# Patient Record
Sex: Male | Born: 1935 | Race: White | Hispanic: No | Smoking: Never smoker
Health system: Southern US, Community
[De-identification: ages and names within clinical notes are randomized; demographics above are authoritative.]

## PROBLEM LIST (undated history)

## (undated) DIAGNOSIS — I38 Endocarditis, valve unspecified: Secondary | ICD-10-CM

## (undated) DIAGNOSIS — M1990 Unspecified osteoarthritis, unspecified site: Secondary | ICD-10-CM

## (undated) DIAGNOSIS — F329 Major depressive disorder, single episode, unspecified: Secondary | ICD-10-CM

## (undated) DIAGNOSIS — R7303 Prediabetes: Secondary | ICD-10-CM

## (undated) DIAGNOSIS — I209 Angina pectoris, unspecified: Secondary | ICD-10-CM

## (undated) DIAGNOSIS — E785 Hyperlipidemia, unspecified: Secondary | ICD-10-CM

## (undated) DIAGNOSIS — I2510 Atherosclerotic heart disease of native coronary artery without angina pectoris: Secondary | ICD-10-CM

## (undated) DIAGNOSIS — I10 Essential (primary) hypertension: Secondary | ICD-10-CM

## (undated) DIAGNOSIS — Q245 Malformation of coronary vessels: Secondary | ICD-10-CM

## (undated) DIAGNOSIS — F32A Depression, unspecified: Secondary | ICD-10-CM

## (undated) DIAGNOSIS — R112 Nausea with vomiting, unspecified: Secondary | ICD-10-CM

## (undated) DIAGNOSIS — Z8719 Personal history of other diseases of the digestive system: Secondary | ICD-10-CM

## (undated) DIAGNOSIS — K219 Gastro-esophageal reflux disease without esophagitis: Secondary | ICD-10-CM

## (undated) DIAGNOSIS — Z87442 Personal history of urinary calculi: Secondary | ICD-10-CM

## (undated) DIAGNOSIS — C61 Malignant neoplasm of prostate: Secondary | ICD-10-CM

## (undated) DIAGNOSIS — N200 Calculus of kidney: Secondary | ICD-10-CM

## (undated) DIAGNOSIS — E119 Type 2 diabetes mellitus without complications: Secondary | ICD-10-CM

## (undated) HISTORY — PX: HEMORRHOID SURGERY: SHX153

## (undated) HISTORY — PX: PROSTATE SURGERY: SHX751

## (undated) HISTORY — PX: TONSILLECTOMY: SUR1361

---

## 2003-11-09 ENCOUNTER — Ambulatory Visit: Payer: Self-pay | Admitting: Urology

## 2003-12-03 ENCOUNTER — Other Ambulatory Visit: Payer: Self-pay

## 2003-12-07 ENCOUNTER — Inpatient Hospital Stay: Payer: Self-pay | Admitting: Urology

## 2006-02-15 ENCOUNTER — Ambulatory Visit: Payer: Self-pay | Admitting: Gastroenterology

## 2006-05-03 ENCOUNTER — Ambulatory Visit: Payer: Self-pay | Admitting: Otolaryngology

## 2007-02-03 DIAGNOSIS — I2109 ST elevation (STEMI) myocardial infarction involving other coronary artery of anterior wall: Secondary | ICD-10-CM

## 2007-02-03 DIAGNOSIS — I2510 Atherosclerotic heart disease of native coronary artery without angina pectoris: Secondary | ICD-10-CM

## 2007-02-03 HISTORY — DX: ST elevation (STEMI) myocardial infarction involving other coronary artery of anterior wall: I21.09

## 2007-02-03 HISTORY — DX: Atherosclerotic heart disease of native coronary artery without angina pectoris: I25.10

## 2007-02-03 HISTORY — PX: CARDIAC CATHETERIZATION: SHX172

## 2007-02-04 DIAGNOSIS — Z951 Presence of aortocoronary bypass graft: Secondary | ICD-10-CM

## 2007-02-04 HISTORY — PX: CORONARY ARTERY BYPASS GRAFT: SHX141

## 2007-02-04 HISTORY — DX: Presence of aortocoronary bypass graft: Z95.1

## 2008-09-18 ENCOUNTER — Ambulatory Visit: Payer: Self-pay | Admitting: Ophthalmology

## 2008-12-30 ENCOUNTER — Ambulatory Visit: Payer: Self-pay | Admitting: Gastroenterology

## 2008-12-30 DIAGNOSIS — D126 Benign neoplasm of colon, unspecified: Secondary | ICD-10-CM

## 2008-12-30 HISTORY — DX: Benign neoplasm of colon, unspecified: D12.6

## 2008-12-30 HISTORY — PX: COLONOSCOPY: SHX174

## 2009-09-01 ENCOUNTER — Ambulatory Visit: Payer: Self-pay | Admitting: Ophthalmology

## 2009-09-14 ENCOUNTER — Ambulatory Visit: Payer: Self-pay | Admitting: Ophthalmology

## 2010-02-10 ENCOUNTER — Ambulatory Visit: Payer: Self-pay | Admitting: Cardiology

## 2010-02-10 HISTORY — PX: CARDIAC CATHETERIZATION: SHX172

## 2010-10-14 ENCOUNTER — Ambulatory Visit: Payer: Self-pay | Admitting: Ophthalmology

## 2013-05-03 DIAGNOSIS — I2510 Atherosclerotic heart disease of native coronary artery without angina pectoris: Secondary | ICD-10-CM | POA: Insufficient documentation

## 2013-05-03 DIAGNOSIS — N200 Calculus of kidney: Secondary | ICD-10-CM | POA: Insufficient documentation

## 2013-12-25 DIAGNOSIS — E538 Deficiency of other specified B group vitamins: Secondary | ICD-10-CM | POA: Insufficient documentation

## 2014-03-20 ENCOUNTER — Ambulatory Visit: Payer: Self-pay | Admitting: Gastroenterology

## 2014-03-20 HISTORY — PX: COLONOSCOPY: SHX174

## 2015-01-19 ENCOUNTER — Encounter: Payer: Self-pay | Admitting: Emergency Medicine

## 2015-01-19 DIAGNOSIS — N200 Calculus of kidney: Secondary | ICD-10-CM | POA: Insufficient documentation

## 2015-01-19 DIAGNOSIS — R109 Unspecified abdominal pain: Secondary | ICD-10-CM | POA: Diagnosis present

## 2015-01-19 LAB — CBC
HCT: 39.4 % — ABNORMAL LOW (ref 40.0–52.0)
Hemoglobin: 14 g/dL (ref 13.0–18.0)
MCH: 31.2 pg (ref 26.0–34.0)
MCHC: 35.5 g/dL (ref 32.0–36.0)
MCV: 87.7 fL (ref 80.0–100.0)
PLATELETS: 128 10*3/uL — AB (ref 150–440)
RBC: 4.5 MIL/uL (ref 4.40–5.90)
RDW: 13.2 % (ref 11.5–14.5)
WBC: 5.1 10*3/uL (ref 3.8–10.6)

## 2015-01-19 LAB — COMPREHENSIVE METABOLIC PANEL
ALT: 27 U/L (ref 17–63)
ANION GAP: 10 (ref 5–15)
AST: 28 U/L (ref 15–41)
Albumin: 4.4 g/dL (ref 3.5–5.0)
Alkaline Phosphatase: 50 U/L (ref 38–126)
BUN: 21 mg/dL — ABNORMAL HIGH (ref 6–20)
CALCIUM: 9.5 mg/dL (ref 8.9–10.3)
CHLORIDE: 103 mmol/L (ref 101–111)
CO2: 26 mmol/L (ref 22–32)
CREATININE: 1.2 mg/dL (ref 0.61–1.24)
GFR calc non Af Amer: 56 mL/min — ABNORMAL LOW (ref >60)
Glucose, Bld: 159 mg/dL — ABNORMAL HIGH (ref 65–99)
Potassium: 3.9 mmol/L (ref 3.5–5.1)
SODIUM: 139 mmol/L (ref 135–145)
Total Bilirubin: 0.8 mg/dL (ref 0.3–1.2)
Total Protein: 6.8 g/dL (ref 6.5–8.1)

## 2015-01-19 LAB — URINALYSIS COMPLETE WITH MICROSCOPIC (ARMC ONLY)
BILIRUBIN URINE: NEGATIVE
Bacteria, UA: NONE SEEN
Glucose, UA: NEGATIVE mg/dL
KETONES UR: NEGATIVE mg/dL
LEUKOCYTES UA: NEGATIVE
Nitrite: NEGATIVE
PROTEIN: 30 mg/dL — AB
Specific Gravity, Urine: 1.021 (ref 1.005–1.030)
pH: 8 (ref 5.0–8.0)

## 2015-01-19 MED ORDER — ONDANSETRON HCL 4 MG/2ML IJ SOLN
4.0000 mg | Freq: Once | INTRAMUSCULAR | Status: AC
  Administered 2015-01-20: 4 mg via INTRAVENOUS
  Filled 2015-01-19: qty 2

## 2015-01-19 NOTE — ED Notes (Signed)
Pt arrived to the ED accompanied by family for complaints of right flank pain with a hx of kidney stones. Pt states that this feels just like when he is passing a kidney stone. Pt is AOx4 in moderate pain distress.

## 2015-01-20 ENCOUNTER — Emergency Department: Payer: Medicare Other

## 2015-01-20 ENCOUNTER — Emergency Department
Admission: EM | Admit: 2015-01-20 | Discharge: 2015-01-20 | Disposition: A | Discharge status: Home / Self Care | Payer: Medicare Other | Attending: Emergency Medicine | Admitting: Emergency Medicine

## 2015-01-20 DIAGNOSIS — N200 Calculus of kidney: Secondary | ICD-10-CM

## 2015-01-20 DIAGNOSIS — R109 Unspecified abdominal pain: Secondary | ICD-10-CM

## 2015-01-20 HISTORY — DX: Calculus of kidney: N20.0

## 2015-01-20 HISTORY — DX: Malformation of coronary vessels: Q24.5

## 2015-01-20 MED ORDER — ONDANSETRON HCL 4 MG/2ML IJ SOLN: 4.0000 mg | Freq: Once | INTRAMUSCULAR | Status: DC

## 2015-01-20 MED ORDER — SODIUM CHLORIDE 0.9 % IV BOLUS (SEPSIS)
1000.0000 mL | Freq: Once | INTRAVENOUS | Status: AC
  Administered 2015-01-20: 1000 mL via INTRAVENOUS

## 2015-01-20 MED ORDER — MORPHINE SULFATE (PF) 2 MG/ML IV SOLN: 2.0000 mg | Freq: Once | INTRAVENOUS | Status: DC

## 2015-01-20 MED ORDER — ONDANSETRON 4 MG PO TBDP: 4.0000 mg | ORAL_TABLET | Freq: Three times a day (TID) | ORAL | Status: DC | PRN

## 2015-01-20 MED ORDER — TAMSULOSIN HCL 0.4 MG PO CAPS: 0.4000 mg | ORAL_CAPSULE | Freq: Every day | ORAL | Status: DC

## 2015-01-20 MED ORDER — TAMSULOSIN HCL 0.4 MG PO CAPS
0.4000 mg | ORAL_CAPSULE | Freq: Once | ORAL | Status: AC
  Administered 2015-01-20: 0.4 mg via ORAL
  Filled 2015-01-20: qty 1

## 2015-01-20 MED ORDER — KETOROLAC TROMETHAMINE 30 MG/ML IJ SOLN
30.0000 mg | Freq: Once | INTRAMUSCULAR | Status: AC
  Administered 2015-01-20: 30 mg via INTRAVENOUS
  Filled 2015-01-20: qty 1

## 2015-01-20 MED ORDER — MORPHINE SULFATE (PF) 2 MG/ML IV SOLN
2.0000 mg | Freq: Once | INTRAVENOUS | Status: AC
  Administered 2015-01-20: 2 mg via INTRAVENOUS
  Filled 2015-01-20: qty 1

## 2015-01-20 MED ORDER — OXYCODONE-ACETAMINOPHEN 5-325 MG PO TABS: 1.0000 | ORAL_TABLET | ORAL | Status: DC | PRN

## 2015-01-20 NOTE — ED Provider Notes (Signed)
[facility] Emergency Department Provider Note  ____________________________________________  Time seen: 1:25 AM  I have reviewed the triage vital signs and the nursing notes.   HISTORY  Chief Complaint Nephrolithiasis     HPI Adam Nelson is a 81 y.o. male presents with acute onset of right flank pain currently 7 out of 10 at 7:00 last night. Patient also admits to nausea accompanying the pain. Patient has a history of kidney stones she states proximal was 7 years ago for which she was seen by Dr. Yves Dill.     Past Medical History  Diagnosis Date  . Kidney stones   . Coronary artery anomaly     There are no active problems to display for this patient.   Past surgical history None No current outpatient prescriptions on file.  Allergies No known drug allergies History reviewed. No pertinent family history.  Social History Social History  Substance Use Topics  . Smoking status: Never Smoker   . Smokeless tobacco: None  . Alcohol Use: No    Review of Systems  Constitutional: Negative for fever. Eyes: Negative for visual changes. ENT: Negative for sore throat. Cardiovascular: Negative for chest pain. Respiratory: Negative for shortness of breath. Gastrointestinal: Positive for right flank pain and nausea Genitourinary: Negative for dysuria. Musculoskeletal: Negative for back pain. Skin: Negative for rash. Neurological: Negative for headaches, focal weakness or numbness.   10-point ROS otherwise negative.  ____________________________________________   PHYSICAL EXAM:  VITAL SIGNS: ED Triage Vitals  Enc Vitals Group     BP 01/19/15 2147 120/80 mmHg     Pulse Rate 01/19/15 2147 58     Resp 01/19/15 2147 20     Temp 01/19/15 2147 97.6 F (36.4 C)     Temp Source 01/19/15 2147 Oral     SpO2 01/19/15 2147 100 %     Weight 01/19/15 2147 160 lb (72.576 kg)     Height 01/19/15 2147 5\' 4"  (1.626 m)     Head Cir --      Peak  Flow --      Pain Score 01/19/15 2154 9     Pain Loc --      Pain Edu? --      Excl. in [place]? --      Constitutional: Alert and oriented. Apparent discomfort Eyes: Conjunctivae are normal. PERRL. Normal extraocular movements. ENT   Head: Normocephalic and atraumatic.   Nose: No congestion/rhinnorhea.   Mouth/Throat: Mucous membranes are moist.   Neck: No stridor. Hematological/Lymphatic/Immunilogical: No cervical lymphadenopathy. Cardiovascular: Normal rate, regular rhythm. Normal and symmetric distal pulses are present in all extremities. No murmurs, rubs, or gallops. Respiratory: Normal respiratory effort without tachypnea nor retractions. Breath sounds are clear and equal bilaterally. No wheezes/rales/rhonchi. Gastrointestinal: Soft and nontender. No distention. There is no CVA tenderness. Genitourinary: deferred Musculoskeletal: Nontender with normal range of motion in all extremities. No joint effusions.  No lower extremity tenderness nor edema. Neurologic:  Normal speech and language. No gross focal neurologic deficits are appreciated. Speech is normal.  Skin:  Skin is warm, dry and intact. No rash noted. Psychiatric: Mood and affect are normal. Speech and behavior are normal. Patient exhibits appropriate insight and judgment.  ____________________________________________    LABS (pertinent positives/negatives)  Labs Reviewed  COMPREHENSIVE METABOLIC PANEL - Abnormal; Notable for the following:    Glucose, Bld 159 (*)    BUN 21 (*)    GFR calc non Af Amer 56 (*)    All other components  within normal limits  CBC - Abnormal; Notable for the following:    HCT 39.4 (*)    Platelets 128 (*)    All other components within normal limits  URINALYSIS COMPLETEWITH MICROSCOPIC ([facility] ONLY) - Abnormal; Notable for the following:    Color, Urine YELLOW (*)    APPearance CLEAR (*)    Hgb urine dipstick 1+ (*)    Protein, ur 30 (*)    Squamous Epithelial / LPF 0-5 (*)     All other components within normal limits     RADIOLOGY  CT RENAL STONE STUDY (Final result) Result time: 01/20/15 02:41:38   Final result by Rad Results In Interface (01/20/15 02:41:38)   Narrative:   CLINICAL DATA: Right flank pain. History kidney stones.  EXAM: CT ABDOMEN AND PELVIS WITHOUT CONTRAST  TECHNIQUE: Multidetector CT imaging of the abdomen and pelvis was performed following the standard protocol without IV contrast.  COMPARISON: None.  FINDINGS: Lower chest: The included lung bases are clear. Coronary artery calcifications are seen.  Liver: Unremarkable noncontrast appearance.  Hepatobiliary: Multiple dependent gallstones within physiologically distended gallbladder. No pericholecystic inflammatory change. No biliary dilatation.  Pancreas: Diffuse fatty infiltration of the pancreas. No ductal dilatation or inflammation.  Spleen: Normal.  Adrenal glands: No nodule.  Kidneys: Punctate 2 mm obstructing stone at the right ureterovesicular junction with mild resultant hydroureteronephrosis and perinephric stranding. No additional nonobstructing stone in either kidney. No left obstructive uropathy.  Stomach/Bowel: Stomach physiologically distended. There are no dilated or thickened small bowel loops. Small volume of stool throughout the colon without colonic wall thickening. The appendix is normal.  Vascular/Lymphatic: No retroperitoneal adenopathy. Abdominal aorta is normal in caliber.  Reproductive: Post prostatectomy.  Bladder: Physiologically distended. No wall thickening.  Other: No free air, free fluid, or intra-abdominal fluid collection.  Musculoskeletal: There are no acute or suspicious osseous abnormalities. Bones under mineralized. There is degenerative change in the spine.  IMPRESSION: 1. Obstructing 2 mm stone at the right ureterovesicular junction with mild resultant hydroureteronephrosis. 2. Incidental note of  cholelithiasis.   Electronically Signed By: Jeb Levering M.D. On: 01/20/2015 02:41           INITIAL IMPRESSION / ASSESSMENT AND PLAN / ED COURSE  Pertinent labs & imaging results that were available during my care of the patient were reviewed by me and considered in my medical decision making (see chart for details).  Patient received morphine 2 mg as well as Zofran 4 mg, 1 L normal saline with improvement of discomfort   ____________________________________________   FINAL CLINICAL IMPRESSION(S) / ED DIAGNOSES  Final diagnoses:  Right flank pain  Kidney stone on right side      Gregor Hams, MD 01/20/15 [phone]

## 2015-01-20 NOTE — Discharge Instructions (Signed)
Kidney Stones °Kidney stones (urolithiasis) are deposits that form inside your kidneys. The intense pain is caused by the stone moving through the urinary tract. When the stone moves, the ureter goes into spasm around the stone. The stone is usually passed in the urine.  °CAUSES  °· A disorder that makes certain neck glands produce too much parathyroid hormone (primary hyperparathyroidism). °· A buildup of uric acid crystals, similar to gout in your joints. °· Narrowing (stricture) of the ureter. °· A kidney obstruction present at birth (congenital obstruction). °· Previous surgery on the kidney or ureters. °· Numerous kidney infections. °SYMPTOMS  °· Feeling sick to your stomach (nauseous). °· Throwing up (vomiting). °· Blood in the urine (hematuria). °· Pain that usually spreads (radiates) to the groin. °· Frequency or urgency of urination. °DIAGNOSIS  °· Taking a history and physical exam. °· Blood or urine tests. °· CT scan. °· Occasionally, an examination of the inside of the urinary bladder (cystoscopy) is performed. °TREATMENT  °· Observation. °· Increasing your fluid intake. °· Extracorporeal shock wave lithotripsy--This is a noninvasive procedure that uses shock waves to break up kidney stones. °· Surgery may be needed if you have severe pain or persistent obstruction. There are various surgical procedures. Most of the procedures are performed with the use of small instruments. Only small incisions are needed to accommodate these instruments, so recovery time is minimized. °The size, location, and chemical composition are all important variables that will determine the proper choice of action for you. Talk to your health care provider to better understand your situation so that you will minimize the risk of injury to yourself and your kidney.  °HOME CARE INSTRUCTIONS  °· Drink enough water and fluids to keep your urine clear or pale yellow. This will help you to pass the stone or stone fragments. °· Strain  all urine through the provided strainer. Keep all particulate matter and stones for your health care provider to see. The stone causing the pain may be as small as a grain of salt. It is very important to use the strainer each and every time you pass your urine. The collection of your stone will allow your health care provider to analyze it and verify that a stone has actually passed. The stone analysis will often identify what you can do to reduce the incidence of recurrences. °· Only take over-the-counter or prescription medicines for pain, discomfort, or fever as directed by your health care provider. °· Keep all follow-up visits as told by your health care provider. This is important. °· Get follow-up X-rays if required. The absence of pain does not always mean that the stone has passed. It may have only stopped moving. If the urine remains completely obstructed, it can cause loss of kidney function or even complete destruction of the kidney. It is your responsibility to make sure X-rays and follow-ups are completed. Ultrasounds of the kidney can show blockages and the status of the kidney. Ultrasounds are not associated with any radiation and can be performed easily in a matter of minutes. °· Make changes to your daily diet as told by your health care provider. You may be told to: °¨ Limit the amount of salt that you eat. °¨ Eat 5 or more servings of fruits and vegetables each day. °¨ Limit the amount of meat, poultry, fish, and eggs that you eat. °· Collect a 24-hour urine sample as told by your health care provider. You may need to collect another urine sample every 6-12   months. °SEEK MEDICAL CARE IF: °· You experience pain that is progressive and unresponsive to any pain medicine you have been prescribed. °SEEK IMMEDIATE MEDICAL CARE IF:  °· Pain cannot be controlled with the prescribed medicine. °· You have a fever or shaking chills. °· The severity or intensity of pain increases over 18 hours and is not  relieved by pain medicine. °· You develop a new onset of abdominal pain. °· You feel faint or pass out. °· You are unable to urinate. °  °This information is not intended to replace advice given to you by your health care provider. Make sure you discuss any questions you have with your health care provider. °  °Document Released: 01/02/2005 Document Revised: 09/23/2014 Document Reviewed: 06/05/2012 °Elsevier Interactive Patient Education ©2016 [employer] ° °

## 2015-01-20 NOTE — ED Notes (Signed)

## 2015-07-05 DIAGNOSIS — I10 Essential (primary) hypertension: Secondary | ICD-10-CM | POA: Insufficient documentation

## 2015-07-05 DIAGNOSIS — E782 Mixed hyperlipidemia: Secondary | ICD-10-CM | POA: Insufficient documentation

## 2016-01-04 DIAGNOSIS — Z8546 Personal history of malignant neoplasm of prostate: Secondary | ICD-10-CM | POA: Insufficient documentation

## 2017-03-29 IMAGING — CT CT RENAL STONE PROTOCOL
2 of 4 series · 17 of 46 positions shown, 19 images · non-contrast
Comparison: None.

CLINICAL DATA: Right flank pain.  History kidney stones.

EXAM:
CT ABDOMEN AND PELVIS WITHOUT CONTRAST
TECHNIQUE: Multidetector CT imaging of the abdomen and pelvis was performed
following the standard protocol without IV contrast.

[Series 2: stone standard full · axial · 0.66mm/px · z∈[-502,-92]mm · 14 of 90 slices shown, 16 images]
[im 4/90  soft-tissue]
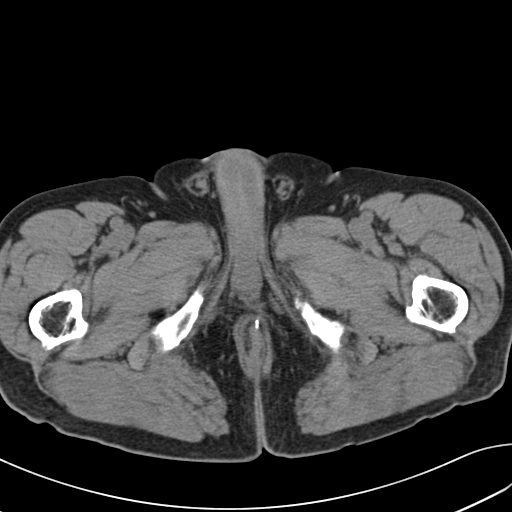
[im 4/90  bone]
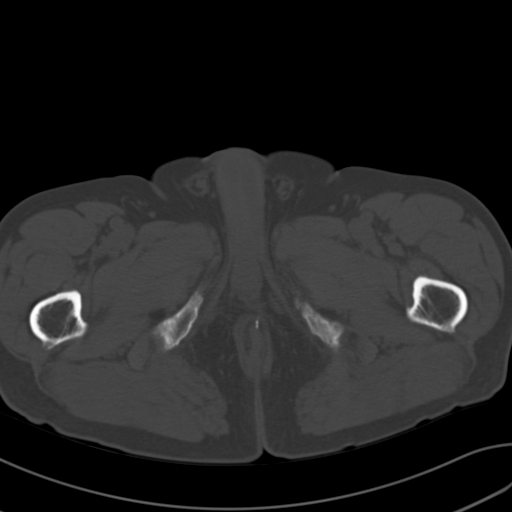
[im 12/90  soft-tissue]
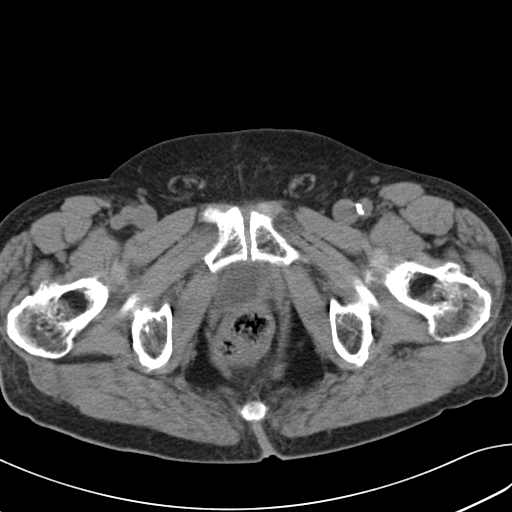
[im 16/90  soft-tissue]
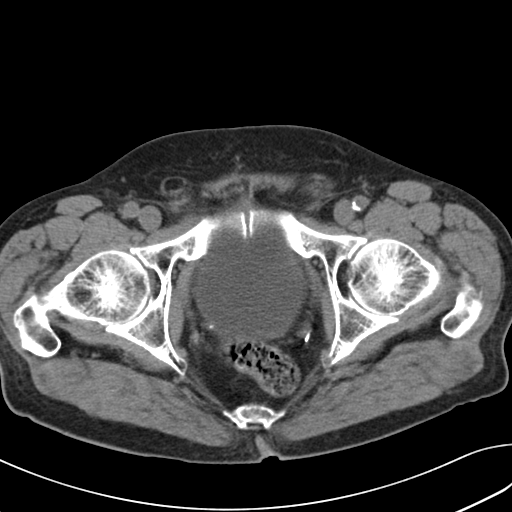
[im 24/90  soft-tissue]
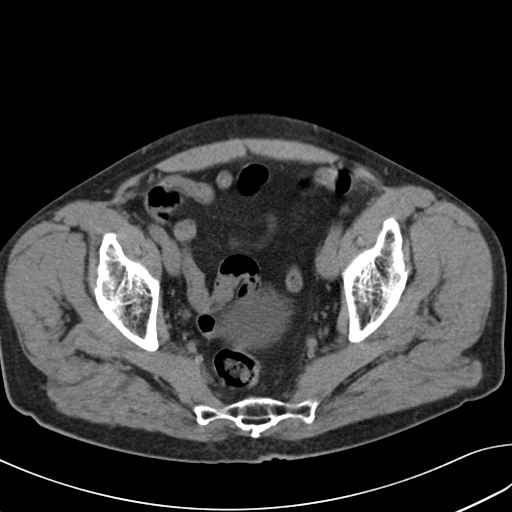
[im 31/90  soft-tissue]
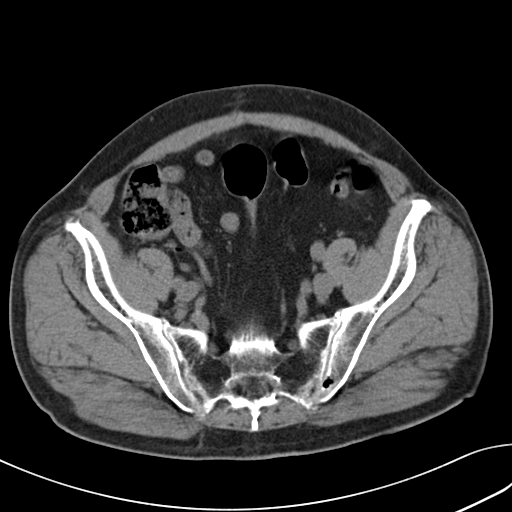
[im 35/90  soft-tissue]
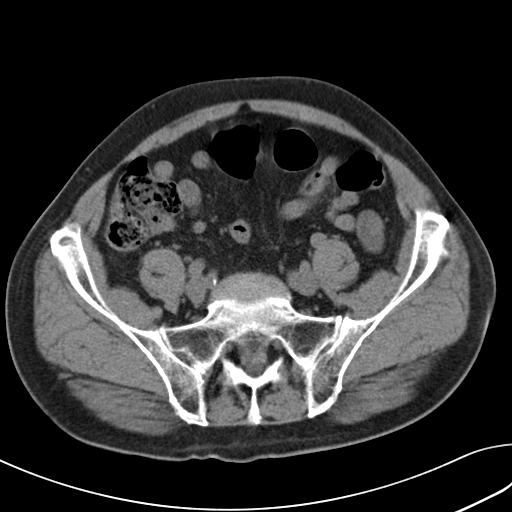
[im 43/90  soft-tissue]
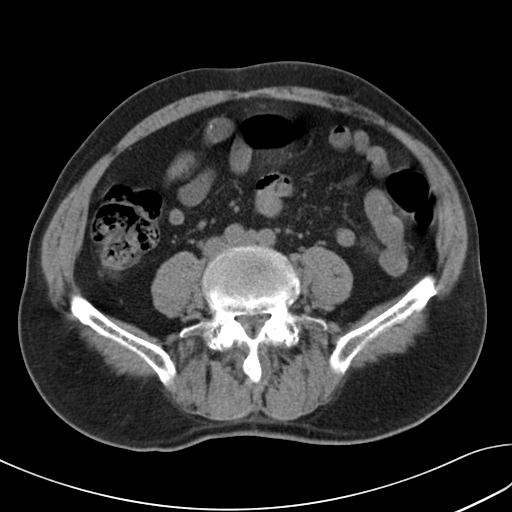
[im 47/90  soft-tissue]
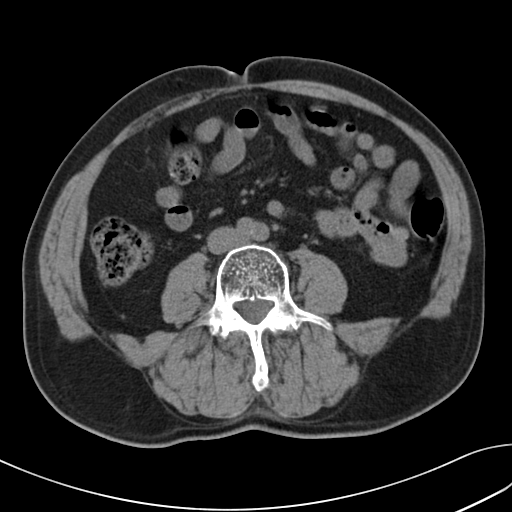
[im 55/90  soft-tissue]
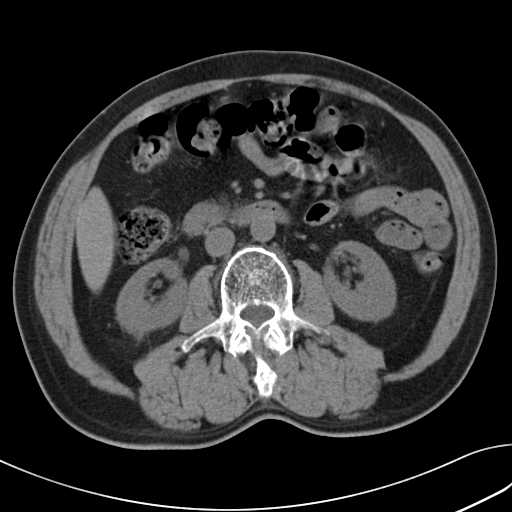
[im 55/90  bone]
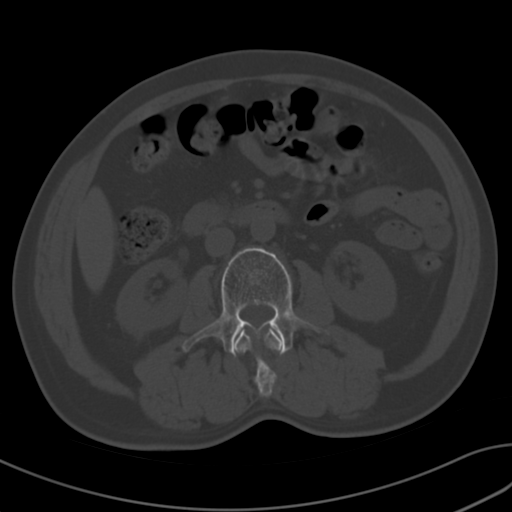
[im 59/90  soft-tissue]
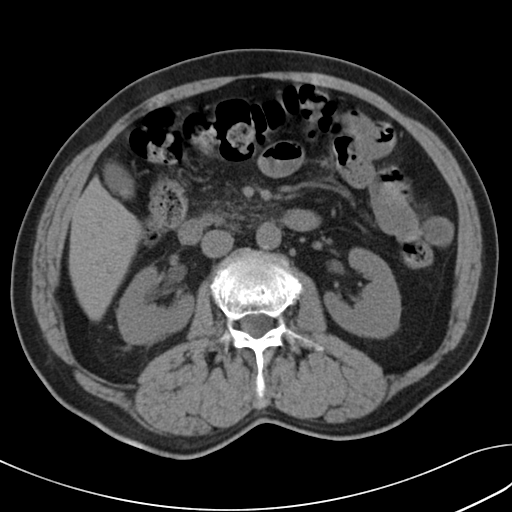
[im 66/90  soft-tissue]
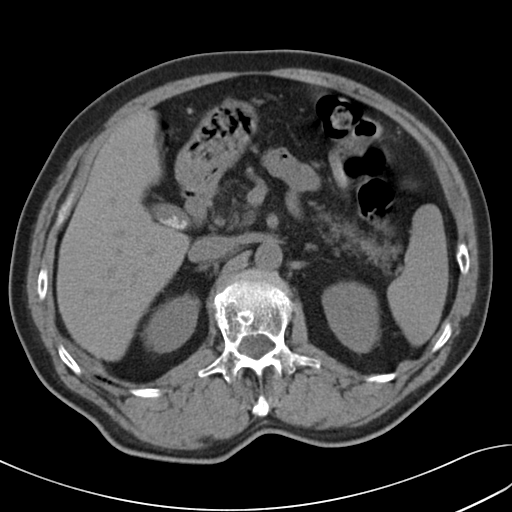
[im 74/90  soft-tissue]
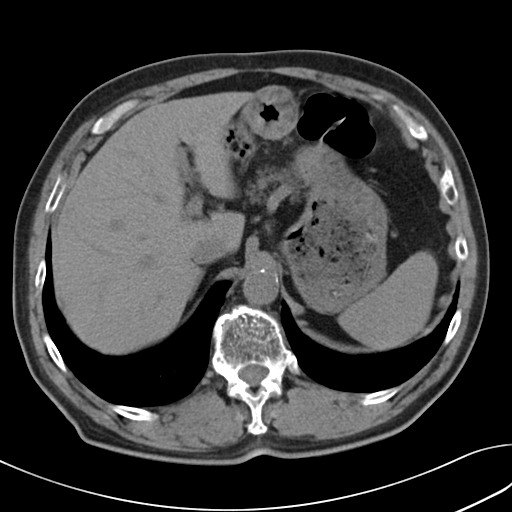
[im 78/90  soft-tissue]
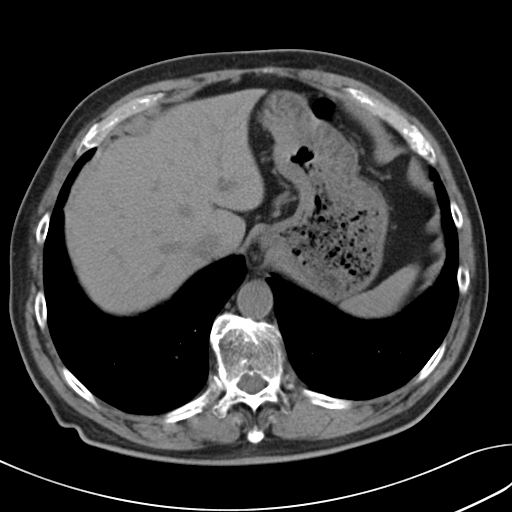
[im 86/90  soft-tissue]
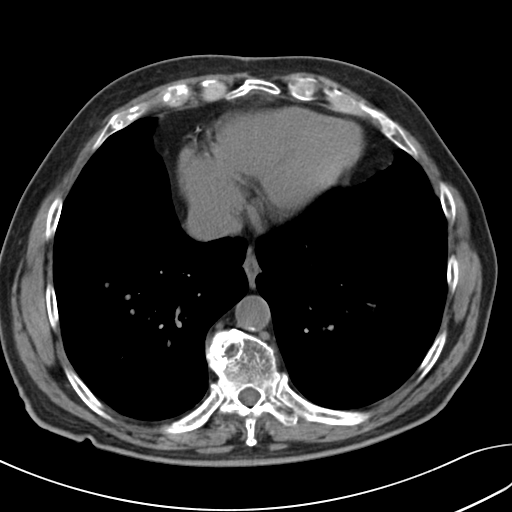

[Series 5: cor stone standard full · coronal · 0.92mm/px · 3 of 136 slices shown]
[im 46/136  soft-tissue]
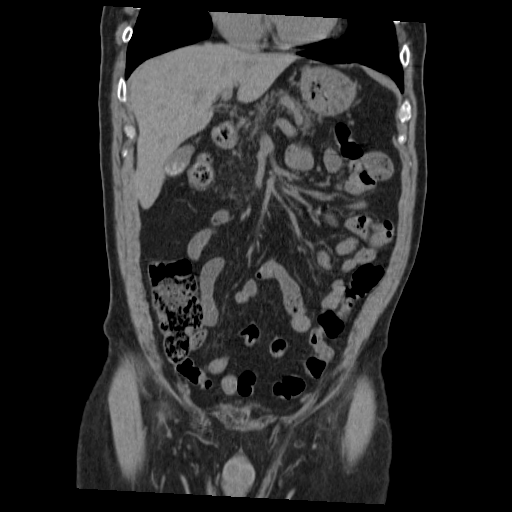
[im 61/136  soft-tissue]
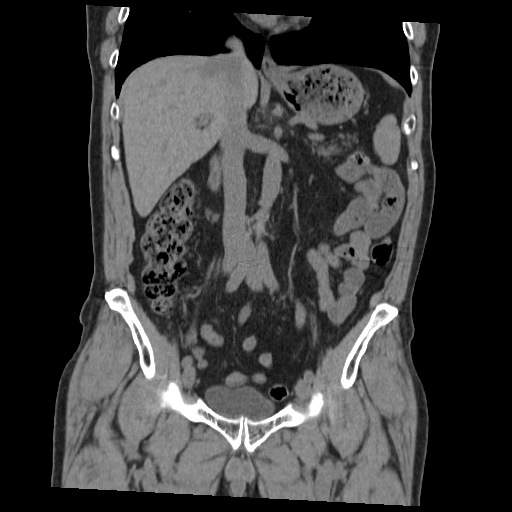
[im 76/136  soft-tissue]
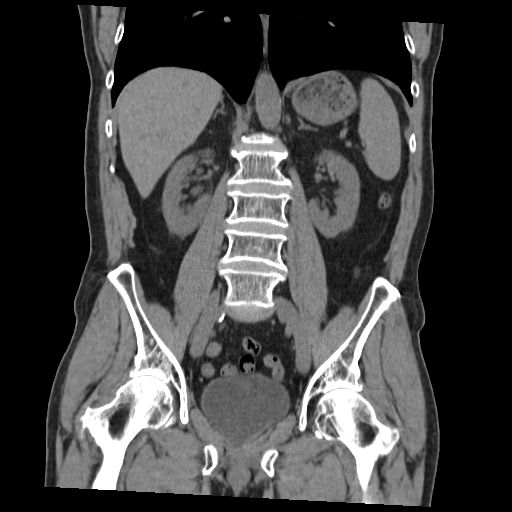

[17 of 46 positions shown; findings below may reference images not displayed]

FINDINGS: Lower chest: The included lung bases are clear. Coronary artery
calcifications are seen.

Liver: Unremarkable noncontrast appearance.

Hepatobiliary: Multiple dependent gallstones within physiologically
distended gallbladder. No pericholecystic inflammatory change. No
biliary dilatation.

Pancreas: Diffuse fatty infiltration of the pancreas. No ductal
dilatation or inflammation.

Spleen: Normal.

Adrenal glands: No nodule.

Kidneys: Punctate 2 mm obstructing stone at the right
ureterovesicular junction with mild resultant hydroureteronephrosis
and perinephric stranding. No additional nonobstructing stone in
either kidney. No left obstructive uropathy.

Stomach/Bowel: Stomach physiologically distended. There are no
dilated or thickened small bowel loops. Small volume of stool
throughout the colon without colonic wall thickening. The appendix
is normal.

Vascular/Lymphatic: No retroperitoneal adenopathy. Abdominal aorta
is normal in caliber.

Reproductive: Post prostatectomy.

Bladder: Physiologically distended.  No wall thickening.

Other: No free air, free fluid, or intra-abdominal fluid collection.

Musculoskeletal: There are no acute or suspicious osseous
abnormalities. Bones under mineralized. There is degenerative change
in the spine.
IMPRESSION: 1. Obstructing 2 mm stone at the right ureterovesicular junction
with mild resultant hydroureteronephrosis.
2. Incidental note of cholelithiasis.

## 2017-06-27 DIAGNOSIS — E1151 Type 2 diabetes mellitus with diabetic peripheral angiopathy without gangrene: Secondary | ICD-10-CM | POA: Insufficient documentation

## 2019-10-29 ENCOUNTER — Other Ambulatory Visit: Payer: Self-pay | Admitting: Cardiology

## 2019-10-29 DIAGNOSIS — R5383 Other fatigue: Secondary | ICD-10-CM

## 2019-10-29 DIAGNOSIS — I2510 Atherosclerotic heart disease of native coronary artery without angina pectoris: Secondary | ICD-10-CM

## 2019-11-05 ENCOUNTER — Other Ambulatory Visit: Payer: Self-pay

## 2019-11-05 ENCOUNTER — Encounter (INDEPENDENT_AMBULATORY_CARE_PROVIDER_SITE_OTHER): Payer: Self-pay

## 2019-11-05 ENCOUNTER — Encounter
Admission: RE | Admit: 2019-11-05 | Discharge: 2019-11-05 | Disposition: A | Discharge status: Home / Self Care | Payer: Medicare Other | Source: Ambulatory Visit | Attending: Cardiology | Admitting: Cardiology

## 2019-11-05 DIAGNOSIS — R5383 Other fatigue: Secondary | ICD-10-CM | POA: Insufficient documentation

## 2019-11-05 DIAGNOSIS — I2510 Atherosclerotic heart disease of native coronary artery without angina pectoris: Secondary | ICD-10-CM | POA: Diagnosis present

## 2019-11-05 LAB — NM MYOCAR MULTI W/SPECT W/WALL MOTION / EF
Estimated workload: 1 METS
Exercise duration (min): 1 min
Exercise duration (sec): 0 s
LV dias vol: 95 mL (ref 62–150)
LV sys vol: 34 mL
MPHR: 137 {beats}/min
Peak HR: 103 {beats}/min
Percent HR: 75 %
Rest HR: 68 {beats}/min
SDS: 0
SRS: 11
SSS: 6
TID: 1.03

## 2019-11-05 MED ORDER — TECHNETIUM TC 99M TETROFOSMIN IV KIT
10.0000 | PACK | Freq: Once | INTRAVENOUS | Status: AC | PRN
  Administered 2019-11-05: 10.86 via INTRAVENOUS

## 2019-11-05 MED ORDER — TECHNETIUM TC 99M TETROFOSMIN IV KIT
32.9100 | PACK | Freq: Once | INTRAVENOUS | Status: AC | PRN
  Administered 2019-11-05: 32.91 via INTRAVENOUS

## 2019-11-05 MED ORDER — REGADENOSON 0.4 MG/5ML IV SOLN
0.4000 mg | Freq: Once | INTRAVENOUS | Status: AC
  Administered 2019-11-05: 0.4 mg via INTRAVENOUS

## 2020-09-22 ENCOUNTER — Encounter: Payer: Self-pay | Admitting: Surgery

## 2020-09-22 ENCOUNTER — Encounter
Admission: RE | Admit: 2020-09-22 | Discharge: 2020-09-22 | Disposition: A | Discharge status: Home / Self Care | Payer: Medicare Other | Source: Ambulatory Visit | Attending: Surgery | Admitting: Surgery

## 2020-09-22 ENCOUNTER — Other Ambulatory Visit: Payer: Self-pay

## 2020-09-22 ENCOUNTER — Ambulatory Visit: Payer: Self-pay | Admitting: Surgery

## 2020-09-22 HISTORY — DX: Depression, unspecified: F32.A

## 2020-09-22 HISTORY — DX: Personal history of other diseases of the digestive system: Z87.19

## 2020-09-22 HISTORY — DX: Essential (primary) hypertension: I10

## 2020-09-22 HISTORY — DX: Personal history of urinary calculi: Z87.442

## 2020-09-22 HISTORY — DX: Hyperlipidemia, unspecified: E78.5

## 2020-09-22 HISTORY — DX: Unspecified osteoarthritis, unspecified site: M19.90

## 2020-09-22 HISTORY — DX: Gastro-esophageal reflux disease without esophagitis: K21.9

## 2020-09-22 NOTE — H&P (Signed)
Subjective:   CC: Non-recurrent unilateral inguinal hernia without obstruction or gangrene [K40.90]  HPI: Adam Nelson is a 86 y.o. male who was referred by Donnamarie Rossetti, * for evaluation of above. First noted several days ago. Asymptomatic but visible bulge that is reducible.  Past Medical History: has a past medical history of CAD (coronary artery disease), Colon polyps (12/30/2008), Diabetes mellitus type 2, diet-controlled (CMS-HCC), GERD (gastroesophageal reflux disease), HTN (hypertension) (07/27/2013), Hyperlipidemia (07/27/2013), Kidney stones, Major depressive disorder, recurrent, mild (CMS-HCC) (07/08/2020), Myocardial infarction (CMS-HCC) (2009), Prostate cancer (CMS-HCC), and Type 2 diabetes mellitus with peripheral angiopathy (CMS-HCC).  Past Surgical History:  Past Surgical History:  Procedure Laterality Date   COLONOSCOPY 12/30/2008   COLONOSCOPY 03/20/2014  PHx of tubular adenoma/Entire examined colon is normal/Repeat 86yr/PYO   CORONARY ARTERY BYPASS GRAFT 01/2007  x4   HEMORROIDECTOMY   PROSTATE SURGERY 2005   TONSILLECTOMY AND ADENOIDECTOMY   Family History: family history includes Alzheimer's disease in his mother; High blood pressure (Hypertension) in his father; Leukemia in his sister; Stroke in his father.  Social History: reports that he has never smoked. He has never used smokeless tobacco. He reports that he does not drink alcohol and does not use drugs.  Current Medications: has a current medication list which includes the following prescription(s): anucort-hc, ascorbic acid (vitamin c), cyanocobalamin, diphenhydramine, fluticasone propionate, loratadine, magnesium, multivitamin, nitroglycerin, omeprazole, rosuvastatin, vit b6-mag cit,oxid-potass cit, vitamin e, aspirin, multivitamin with minerals, and paroxetine.  Allergies:  Allergies as of 09/01/2020   (No Known Allergies)   ROS:  A 15 point review of systems was performed and pertinent positives and  negatives noted in HPI  Objective:    BP 118/65  Pulse 74  Ht 162.6 cm ('5\' 4"'$ )  Wt 69.2 kg (152 lb 9.5 oz)  BMI 26.19 kg/m   Constitutional : alert, appears stated age, cooperative and no distress  Lymphatics/Throat: no asymmetry, masses, or scars  Respiratory: clear to auscultation bilaterally  Cardiovascular: regular rate and rhythm  Gastrointestinal: soft, non-tender; bowel sounds normal; no masses, no organomegaly. inguinal hernia noted. small, reducible, no overlying skin changes and RIGHT  Musculoskeletal: Steady gait and movement  Skin: Cool and moist, visible surgical scars open prostatectomy  Psychiatric: Normal affect, non-agitated, not confused    LABS:  n/a   RADS: n/a Assessment:    Non-recurrent unilateral inguinal hernia without obstruction or gangrene [K40.90], RIGHT  Plan:    1. Non-recurrent unilateral inguinal hernia without obstruction or gangrene [K40.90]  Discussed the risk of surgery including recurrence, which can be up to 50% in the case of incisional or complex hernias, possible use of prosthetic materials (mesh) and the increased risk of mesh infxn if used, bleeding, chronic pain, post-op infxn, post-op SBO or ileus, and possible re-operation to address said risks. The risks of general anesthetic, if used, includes MI, CVA, sudden death or even reaction to anesthetic medications also discussed. Alternatives include continued observation. Benefits include possible symptom relief, prevention of incarceration, strangulation, enlargement in size over time, and the risk of emergency surgery in the face of strangulation.   Typical post-op recovery time of 3-5 days with 2 weeks of activity restrictions were also discussed.  ED return precautions given for sudden increase in pain, size of hernia with accompanying fever, nausea, and/or vomiting.  The patient verbalized understanding and all questions were answered to the patient's satisfaction.

## 2020-09-22 NOTE — Progress Notes (Signed)
Perioperative Services  Pre-Admission/Anesthesia Testing Clinical Review  Date: 09/23/20  Patient Demographics:  Name: Adam Nelson DOB:   12-02-1934 MRN:   [id number]  Planned Surgical Procedure(s):    Case: [id number] Date/Time: 09/24/20 1316   Procedure: XI ROBOTIC ASSISTED INGUINAL HERNIA   Anesthesia type: General   Pre-op diagnosis: Non-recurrent unilateral inguinal hernia without obstruction or gangrene K40.90   Location: [facility] OR ROOM 04 / [place] [facility]   Surgeons: Benjamine Sprague, DO     NOTE: Available PAT nursing documentation and vital signs have been reviewed. Clinical nursing staff has updated patient's PMH/PSHx, current medication list, and drug allergies/intolerances to ensure comprehensive history available to assist in medical decision making as it pertains to the aforementioned surgical procedure and anticipated anesthetic course. Extensive review of available clinical information performed. [facility] and PSHx updated with any diagnoses/procedures that  may have been inadvertently omitted during his intake with the pre-admission testing department's nursing staff.  Clinical Discussion:  Adam Nelson is a 86 y.o. male who is submitted for pre-surgical anesthesia review and clearance prior to him undergoing the above procedure. Patient has never been a smoker. Pertinent PMH includes: CAD (s/p CABG), anterolateral STEMI, valvular heart disease, HTN, HLD, T2DM, GERD (on daily PPI), hiatal hernia, OA, depression.  Patient is followed by cardiology Saralyn Pilar, MD). He was last seen in the cardiology clinic on 03/25/2020; notes reviewed.  At the time of his clinic visit, patient doing well overall from a cardiovascular perspective.  He denied any episodes of chest pain, shortness breath, PND, orthopnea, palpitations, significant peripheral edema, vertiginous symptoms, or presyncope/syncope.  PMH significant for cardiovascular diagnoses.  Patient suffered an  anterolateral wall STEMI on 02/03/2007.  TTE revealed moderately reduced left ventricular systolic function with an EF of 35%.  There was trivial mitral valve regurgitation.  No evidence of valvular stenosis.  Diagnostic left heart catheterization was performed revealing significant three-vessel CAD involving the RCA, LCx, and LAD.  LAD felt to be the IRA.  Given three-vessel CAD and complex bifurcation LAD disease, patient was placed on an IABP and CVTS was consulted for consideration of CABG procedure.  Patient underwent four-vessel CABG procedure on 02/04/2007.  LIMA-LAD, SVG-PDA, SVG-D2, and SVG-ramus intermedius bypass grafts were placed.  Diagnostic left heart catheterization was performed on 02/10/2010 revealing a left ventricular systolic function of 85%.  There was moderate apical hypokinesis.  LIMA-LAD bypass graft patent.  SVG-PDA, SVG-DG 1, and SVG-ramus intermedius bypass grafts all occluded.  There was 75% stenosis of the mid LAD and 70% stenosis of the RCA.  Further intervention was deferred opting for aggressive medical management.  Myocardial perfusion imaging study performed on 11/05/2019 revealed a moderately decreased left ventricular systolic function with an EF of 30-44%.  There were no ST or T wave changes noted during stress.  There was a small defect of moderate severity present in the apex location consistent with previous MI.  Study determined to be low risk.  TTE performed on 11/18/2019 revealed mild left ventricular systolic dysfunction with an EF of 45%.  There was mild aortic, mitral, and tricuspid valve regurgitation.  No evidence of valvular stenosis.  Blood pressure is well controlled at 140/80; takes no interventions.  Patient is on a statin for his HLD.  T2DM controlled with diet lifestyle modification; last Hgb A1c was 5.7% when checked on 06/25/2020.  Functional capacity, as defined by DASI, is documented as being >/= 4 METS.  No changes were made to patient's  medication regimen.  Patient to follow-up with outpatient cardiology in 6 months or sooner if needed.  Patient is scheduled to undergo a robot-assisted inguinal hernia repair on 09/24/2020 with Dr. Benjamine Sprague, MD. given patient's past medical history significant for cardiovascular disease and intervention, presurgical cardiac clearance was sought by the PAT team. Per cardiology, "this patient is optimized for surgery and may proceed with the planned procedural course with a LOW risk of significant perioperative cardiovascular complications". This patient is on daily antiplatelet therapy.  He has been instructed on recommendations for holding his daily low-dose ASA for 5 days prior to his procedure with plans to restart as soon as postoperative bleeding risk not be minimized by his primary attending surgeon.  Patient is aware that his last dose of ASA will be on 09/18/2020.  Patient denies previous perioperative complications with anesthesia in the past.  In review of his EMR, there are no available records for review regarding patient's past procedural/anesthetic courses within the [facility] system.  Vitals with BMI 01/20/2015 01/20/2015 01/20/2015  Height - - -  Weight - - -  BMI - - -  Systolic 248 250 037  Diastolic 53 79 67  Pulse 60 - 61    Providers/Specialists:   NOTE: Primary physician provider listed below. Patient may have been seen by APP or partner within same practice.   PROVIDER ROLE / SPECIALTY LAST Shirline Frees, DO General Surgery 09/01/2020  Rusty Aus, MD Primary Care Provider 08/19/2020  Isaias Cowman, MD Cardiology 03/25/2020   Allergies:  Patient has no known allergies.  Current Home Medications:   No current facility-administered medications for this encounter.    Ascorbic Acid (VITAMIN C) 1000 MG tablet   aspirin EC 81 MG tablet   fluticasone (FLONASE) 50 MCG/ACT nasal spray   hydrocortisone (ANUSOL-HC) 25 MG suppository   loratadine (CLARITIN) 10  MG tablet   MAGNESIUM PO   Multiple Vitamin (MULTIVITAMIN WITH MINERALS) TABS tablet   nitroGLYCERIN (NITROSTAT) 0.4 MG SL tablet   omeprazole (PRILOSEC) 40 MG capsule   pyridoxine (B-6) 100 MG tablet   rosuvastatin (CRESTOR) 10 MG tablet   vitamin B-12 (CYANOCOBALAMIN) 1000 MCG tablet   vitamin E 200 UNIT capsule   History:   Past Medical History:  Diagnosis Date   Arthritis    CAD (coronary artery disease)    Depression    GERD (gastroesophageal reflux disease)    History of hiatal hernia    History of kidney stones    Hyperlipidemia    Hypertension    MDD (major depressive disorder)    Prostate cancer (HCC)    S/P CABG x 4 02/04/2007   4v; LIMA-LAD, SVG-PDA, SVG-D2, SVG-ramus intermedius   ST elevation myocardial infarction (STEMI) of anterolateral wall ([place]) 02/03/2007   T2DM (type 2 diabetes mellitus) ([place])    Tubular adenoma of colon 12/30/2008   Valvular heart disease    a.) TTE on 11/18/2019 --> mild aortic, mitral, and tricuspid valve regurgitation; EF 45%.   Past Surgical History:  Procedure Laterality Date   CARDIAC CATHETERIZATION Left 02/03/2007   Location: [facility]; Surgeon: Pura Spice, MD   CARDIAC CATHETERIZATION Left 02/10/2010   Location: [facility]: Surgeon: Isaias Cowman, MD   COLONOSCOPY N/A 12/30/2008   COLONOSCOPY N/A 03/20/2014   CORONARY ARTERY BYPASS GRAFT N/A 02/04/2007   4v CABG (LIMA-LAD, SVG-PDA, SVG-D2, SVG-ramus intermedius); Location: [facility]; Surgeon: Durward Mallard, MD   [place]  No family history on file. Social History   Tobacco Use   Smoking status: Never   Smokeless tobacco: Never  Substance Use Topics   Alcohol use: No   Drug use: No    Pertinent Clinical Results:  LABS: Labs reviewed: Acceptable for surgery.   Ref Range & Units 06/25/2020  WBC (White Blood Cell Count) 4.1 - 10.2 10^3/uL 4.1   RBC (Red Blood Cell Count) 4.69 - 6.13 10^6/uL 4.34 Low    Hemoglobin 14.1  - 18.1 gm/dL 13.5 Low    Hematocrit 40.0 - 52.0 % 38.8 Low    MCV (Mean Corpuscular Volume) 80.0 - 100.0 fl 89.4   MCH (Mean Corpuscular Hemoglobin) 27.0 - 31.2 pg 31.1   MCHC (Mean Corpuscular Hemoglobin Concentration) 32.0 - 36.0 gm/dL 34.8   Platelet Count 150 - 450 10^3/uL 123 Low    RDW-CV (Red Cell Distribution Width) 11.6 - 14.8 % 12.1   MPV (Mean Platelet Volume) 9.4 - 12.4 fl 9.5   Neutrophils 1.50 - 7.80 10^3/uL 2.36   Lymphocytes 1.00 - 3.60 10^3/uL 1.14   Monocytes 0.00 - 1.50 10^3/uL 0.40   Eosinophils 0.00 - 0.55 10^3/uL 0.20   Basophils 0.00 - 0.09 10^3/uL 0.03   Neutrophil % 32.0 - 70.0 % 57.1   Lymphocyte % 10.0 - 50.0 % 27.5   Monocyte % 4.0 - 13.0 % 9.7   Eosinophil % 1.0 - 5.0 % 4.8   Basophil% 0.0 - 2.0 % 0.7   Immature Granulocyte % <=0.7 % 0.2   Immature Granulocyte Count <=0.06 10^3/L 0.01   Resulting Agency  [facility] - [facility]  Specimen Collected: 06/25/20 07:28 Last Resulted: 06/25/20 07:50  Received From: [facility]  Result Received: 09/16/20 09:16    Ref Range & Units 06/25/2020  Glucose 70 - 110 mg/dL 133 High    Sodium 136 - 145 mmol/L 140   Potassium 3.6 - 5.1 mmol/L 4.3   Chloride 97 - 109 mmol/L 105   Carbon Dioxide (CO2) 22.0 - 32.0 mmol/L 30.5   Urea Nitrogen (BUN) 7 - 25 mg/dL 19   Creatinine 0.7 - 1.3 mg/dL 1.1   Glomerular Filtration Rate (eGFR), MDRD Estimate >60 mL/min/1.73sq m 64   Calcium 8.7 - 10.3 mg/dL 9.2   AST  8 - 39 U/L 33   ALT  6 - 57 U/L 41   Alk Phos (alkaline Phosphatase) 34 - 104 U/L 66   Albumin 3.5 - 4.8 g/dL 3.9   Bilirubin, Total 0.3 - 1.2 mg/dL 0.7   Protein, Total 6.1 - 7.9 g/dL 5.9 Low    A/G Ratio 1.0 - 5.0 gm/dL 2.0   Resulting Agency  [facility]  Specimen Collected: 06/25/20 07:28 Last Resulted: 06/25/20 13:05  Received From: [facility]  Result Received: 09/16/20 09:16    Ref Range & Units 06/25/2020  Hemoglobin A1C 4.2 - 5.6 % 5.7 High     Average Blood Glucose (Calc) mg/dL 117   Resulting Agency  [facility] - [facility]    ECG: Date: 09/23/2020 Rate: 78 bpm Rhythm: normal sinus Axis (leads I and aVF): Normal Intervals: PR 192 ms. QRS 74 ms. QTc 430 ms. ST segment and T wave changes: No evidence of acute ST segment elevation or depression Comparison: Similar to previous tracing obtained on 10/23/2019   IMAGING / PROCEDURES: TRANSTHORACIC ECHOCARDIOGRAM performed 11/18/2019 LVEF 45% Mild left ventricular systolic dysfunction Normal right ventricular systolic function Mild AR, MR, and TR  No PR No valvular stenosis No evidence of pericardial effusion  LEFT HEART CATHETERIZATION AND CORONARY ANGIOGRAPHY performed on 02/10/2010 Previous four-vessel CABG Patent LIMA-LAD graft 100% occlusion of the SVG-PDA graft 100% occlusion of the SVG-DG1 graft 100% occlusion of the SVG-ramus intermedius graft 75% stenosis mid LAD 70% stenosis distal RCA Moderate apical hypokinesis LVEF calculated by contrast ventriculography was 49%  CORONARY ARTERY BYPASS GRAFTING performed on 02/04/2007 Four-vessel CABG procedure LIMA-LAD SVG-PDA SVG-D2 SVG-ramus intermedius  LEFT HEART CATHETERIZATION AND CORONARY ANGIOGRAPHY performed on 02/03/2007 Three-vessel CAD 30% stenosis of the mid RCA 60% stenosis of the distal RCA 70% stenosis of the distal RCA 60% stenosis of the ostial to proximal RCA 40% stenosis of the ostial LM 40% stenosis of the LM 30% stenosis of the proximal LCx  20% stenosis of the mid LCx 70% stenosis of the OM2 40% stenosis of the mid LAD  90% stenosis of the mid LAD 60% stenosis of the mid LAD 90% stenosis of the distal LAD 40% stenosis of D1 80% stenosis of D2 50% stenosis of D3 Infarct-related artery likely the LAD XB 3.5 did not seat and LMCA; used XB 3.0 guide to engage the LMCA Given 3 vessel CAD and complex bifurcation LAD disease, decision was made to place on IABP and consult CVTS for  bypass evaluation.  Impression and Plan:  Adam Nelson has been referred for pre-anesthesia review and clearance prior to him undergoing the planned anesthetic and procedural courses. Available labs, pertinent testing, and imaging results were personally reviewed by me. This patient has been appropriately cleared by cardiology with an overall LOW risk of significant perioperative cardiovascular complications.  Based on clinical review performed today (09/23/20), barring any significant acute changes in the patient's overall condition, it is anticipated that he will be able to proceed with the planned surgical intervention. Any acute changes in clinical condition may necessitate his procedure being postponed and/or cancelled. Patient will meet with anesthesia team (MD and/or CRNA) on the day of his procedure for preoperative evaluation/assessment. Questions regarding anesthetic course will be fielded at that time.   Pre-surgical instructions were reviewed with the patient during his PAT appointment and questions were fielded by PAT clinical staff. Patient was advised that if any questions or concerns arise prior to his procedure then he should return a call to PAT and/or his surgeon's office to discuss.  Honor Loh, MSN, APRN, FNP-C, CEN [facility]  Peri-operative Services Nurse Practitioner Phone: [phone] Fax: [phone] 09/23/20 1:37 PM  NOTE: This note has been prepared using Dragon dictation software. Despite my best ability to proofread, there is always the potential that unintentional transcriptional errors may still occur from this process.

## 2020-09-22 NOTE — Patient Instructions (Signed)
Your procedure is scheduled on:09-24-20 Friday Report to the Registration Desk on the 1st floor of the [facility].Then proceed to the 2nd floor Surgery Desk in the [facility] To find out your arrival time, please call [phone] between 1PM - 3PM on:09-23-20 Thursday  REMEMBER: Instructions that are not followed completely may result in serious medical risk, up to and including death; or upon the discretion of your surgeon and anesthesiologist your surgery may need to be rescheduled.  Do not eat food after midnight the night before surgery.  No gum chewing, lozengers or hard candies.  You may however, drink CLEAR liquids up to 2 hours before you are scheduled to arrive for your surgery. Do not drink anything within 2 hours of your scheduled arrival time.  Clear liquids include: - water  - apple juice without pulp - gatorade (not RED, PURPLE, OR BLUE) - black coffee or tea (Do NOT add milk or creamers to the coffee or tea) Do NOT drink anything that is not on this list.  TAKE THESE MEDICATIONS THE MORNING OF SURGERY WITH A SIP OF WATER: -Prilosec (Omeprazole)  81 mg Aspirin was stopped as instructed by Dr Lysle Pearl on 09-18-20 Saturday  One week prior to surgery: Stop Anti-inflammatories (NSAIDS) such as Advil, Aleve, Ibuprofen, Motrin, Naproxen, Naprosyn and Aspirin based products such as Excedrin, Goodys Powder, BC Powder.You may however, continue to take Tylenol if needed for pain up until the day of surgery.  Stop ANY OVER THE COUNTER supplements/vitamins NOW (09-22-20)until after surgery (Vitamin C, Magnesium, multivitamin, Vitamins B-6, B-12, and Vitamin E)  No Alcohol for 24 hours before or after surgery.  No Smoking including e-cigarettes for 24 hours prior to surgery.  No chewable tobacco products for at least 6 hours prior to surgery.  No nicotine patches on the day of surgery.  Do not use any "recreational" drugs for at least a week prior to your surgery.  Please be advised  that the combination of cocaine and anesthesia may have negative outcomes, up to and including death. If you test positive for cocaine, your surgery will be cancelled.  On the morning of surgery brush your teeth with toothpaste and water, you may rinse your mouth with mouthwash if you wish. Do not swallow any toothpaste or mouthwash.  Do not wear jewelry, make-up, hairpins, clips or nail polish.  Do not wear lotions, powders, or perfumes.   Do not shave body from the neck down 48 hours prior to surgery just in case you cut yourself which could leave a site for infection.  Also, freshly shaved skin may become irritated if using the CHG soap.  Contact lenses, hearing aids and dentures may not be worn into surgery.  Do not bring valuables to the hospital. [facility] is not responsible for any missing/lost belongings or valuables.   Use CHG Soap as directed on instruction sheet  Notify your doctor if there is any change in your medical condition (cold, fever, infection).  Wear comfortable clothing (specific to your surgery type) to the hospital.  After surgery, you can help prevent lung complications by doing breathing exercises.  Take deep breaths and cough every 1-2 hours. Your doctor may order a device called an Incentive Spirometer to help you take deep breaths. When coughing or sneezing, hold a pillow firmly against your incision with both hands. This is called "splinting." Doing this helps protect your incision. It also decreases belly discomfort.  If you are being admitted to the hospital overnight, leave  your suitcase in the car. After surgery it may be brought to your room.  If you are being discharged the day of surgery, you will not be allowed to drive home. You will need a responsible adult (18 years or older) to drive you home and stay with you that night.   If you are taking public transportation, you will need to have a responsible adult (18 years or older) with  you. Please confirm with your physician that it is acceptable to use public transportation.   Please call the [facility] Dept. at [phone] if you have any questions about these instructions.  Surgery Visitation Policy:  Patients undergoing a surgery or procedure may have one family member or support person with them as long as that person is not COVID-19 positive or experiencing its symptoms.  That person may remain in the waiting area during the procedure.  Inpatient Visitation:    Visiting hours are 7 a.m. to 8 p.m. Inpatients will be allowed two visitors daily. The visitors may change each day during the patient's stay. No visitors under the age of 47. Any visitor under the age of 35 must be accompanied by an adult. The visitor must pass COVID-19 screenings, use hand sanitizer when entering and exiting the patient's room and wear a mask at all times, including in the patient's room. Patients must also wear a mask when staff or their visitor are in the room. Masking is required regardless of vaccination status.

## 2020-09-24 ENCOUNTER — Ambulatory Visit: Payer: Medicare Other | Admitting: Urgent Care

## 2020-09-24 ENCOUNTER — Other Ambulatory Visit: Payer: Self-pay

## 2020-09-24 ENCOUNTER — Ambulatory Visit
Admission: RE | Admit: 2020-09-24 | Discharge: 2020-09-24 | Disposition: A | Discharge status: Home / Self Care | Payer: Medicare Other | Attending: Surgery | Admitting: Surgery

## 2020-09-24 ENCOUNTER — Encounter
Admission: RE | Disposition: A | Discharge status: Home / Self Care | Payer: Self-pay | Source: Home / Self Care | Attending: Surgery

## 2020-09-24 ENCOUNTER — Encounter: Payer: Self-pay | Admitting: Surgery

## 2020-09-24 DIAGNOSIS — E1151 Type 2 diabetes mellitus with diabetic peripheral angiopathy without gangrene: Secondary | ICD-10-CM | POA: Insufficient documentation

## 2020-09-24 DIAGNOSIS — Z8546 Personal history of malignant neoplasm of prostate: Secondary | ICD-10-CM | POA: Insufficient documentation

## 2020-09-24 DIAGNOSIS — Z86010 Personal history of colonic polyps: Secondary | ICD-10-CM | POA: Insufficient documentation

## 2020-09-24 DIAGNOSIS — Z8249 Family history of ischemic heart disease and other diseases of the circulatory system: Secondary | ICD-10-CM | POA: Diagnosis not present

## 2020-09-24 DIAGNOSIS — Z820 Family history of epilepsy and other diseases of the nervous system: Secondary | ICD-10-CM | POA: Insufficient documentation

## 2020-09-24 DIAGNOSIS — K219 Gastro-esophageal reflux disease without esophagitis: Secondary | ICD-10-CM | POA: Diagnosis not present

## 2020-09-24 DIAGNOSIS — K4090 Unilateral inguinal hernia, without obstruction or gangrene, not specified as recurrent: Secondary | ICD-10-CM | POA: Diagnosis not present

## 2020-09-24 DIAGNOSIS — Z951 Presence of aortocoronary bypass graft: Secondary | ICD-10-CM | POA: Insufficient documentation

## 2020-09-24 DIAGNOSIS — Z823 Family history of stroke: Secondary | ICD-10-CM | POA: Diagnosis not present

## 2020-09-24 DIAGNOSIS — Z87442 Personal history of urinary calculi: Secondary | ICD-10-CM | POA: Insufficient documentation

## 2020-09-24 DIAGNOSIS — I10 Essential (primary) hypertension: Secondary | ICD-10-CM | POA: Insufficient documentation

## 2020-09-24 DIAGNOSIS — I2510 Atherosclerotic heart disease of native coronary artery without angina pectoris: Secondary | ICD-10-CM | POA: Diagnosis not present

## 2020-09-24 DIAGNOSIS — E785 Hyperlipidemia, unspecified: Secondary | ICD-10-CM | POA: Diagnosis not present

## 2020-09-24 DIAGNOSIS — Z79899 Other long term (current) drug therapy: Secondary | ICD-10-CM | POA: Insufficient documentation

## 2020-09-24 HISTORY — DX: Type 2 diabetes mellitus without complications: E11.9

## 2020-09-24 HISTORY — DX: Malignant neoplasm of prostate: C61

## 2020-09-24 HISTORY — PX: INSERTION OF MESH: SHX5868

## 2020-09-24 HISTORY — DX: Major depressive disorder, single episode, unspecified: F32.9

## 2020-09-24 HISTORY — DX: Atherosclerotic heart disease of native coronary artery without angina pectoris: I25.10

## 2020-09-24 HISTORY — DX: Endocarditis, valve unspecified: I38

## 2020-09-24 LAB — GLUCOSE, CAPILLARY
Glucose-Capillary: 91 mg/dL (ref 70–99)
Glucose-Capillary: 128 mg/dL — ABNORMAL HIGH (ref 70–99)

## 2020-09-24 SURGERY — HERNIORRHAPHY, INGUINAL, ROBOT-ASSISTED, LAPAROSCOPIC
Anesthesia: General | Laterality: Right

## 2020-09-24 MED ORDER — CHLORHEXIDINE GLUCONATE 0.12 % MT SOLN
OROMUCOSAL | Status: AC
  Administered 2020-09-24: 15 mL via OROMUCOSAL
  Filled 2020-09-24: qty 15

## 2020-09-24 MED ORDER — OXYCODONE HCL 5 MG PO TABS: 5.0000 mg | ORAL_TABLET | Freq: Once | ORAL | Status: DC | PRN

## 2020-09-24 MED ORDER — LACTATED RINGERS IV SOLN: INTRAVENOUS | Status: DC

## 2020-09-24 MED ORDER — ONDANSETRON HCL 4 MG/2ML IJ SOLN
INTRAMUSCULAR | Status: AC
  Administered 2020-09-24: 4 mg via INTRAVENOUS
  Filled 2020-09-24: qty 2

## 2020-09-24 MED ORDER — BUPIVACAINE-EPINEPHRINE 0.5% -1:200000 IJ SOLN
INTRAMUSCULAR | Status: DC | PRN
  Administered 2020-09-24: 10 mL

## 2020-09-24 MED ORDER — BUPIVACAINE LIPOSOME 1.3 % IJ SUSP
INTRAMUSCULAR | Status: AC
  Filled 2020-09-24: qty 20

## 2020-09-24 MED ORDER — CHLORHEXIDINE GLUCONATE 0.12 % MT SOLN: 15.0000 mL | Freq: Once | OROMUCOSAL | Status: AC

## 2020-09-24 MED ORDER — IBUPROFEN 400 MG PO TABS: 400.0000 mg | ORAL_TABLET | Freq: Three times a day (TID) | ORAL | 0 refills | Status: DC | PRN

## 2020-09-24 MED ORDER — FENTANYL CITRATE (PF) 100 MCG/2ML IJ SOLN
INTRAMUSCULAR | Status: AC
  Filled 2020-09-24: qty 2

## 2020-09-24 MED ORDER — GABAPENTIN 300 MG PO CAPS: 300.0000 mg | ORAL_CAPSULE | ORAL | Status: AC

## 2020-09-24 MED ORDER — ONDANSETRON HCL 4 MG/2ML IJ SOLN
INTRAMUSCULAR | Status: AC
  Filled 2020-09-24: qty 2

## 2020-09-24 MED ORDER — ACETAMINOPHEN 500 MG PO TABS: 1000.0000 mg | ORAL_TABLET | ORAL | Status: AC

## 2020-09-24 MED ORDER — DEXAMETHASONE SODIUM PHOSPHATE 10 MG/ML IJ SOLN
INTRAMUSCULAR | Status: AC
  Filled 2020-09-24: qty 1

## 2020-09-24 MED ORDER — LIDOCAINE HCL (PF) 2 % IJ SOLN
INTRAMUSCULAR | Status: AC
  Filled 2020-09-24: qty 5

## 2020-09-24 MED ORDER — ROCURONIUM BROMIDE 100 MG/10ML IV SOLN
INTRAVENOUS | Status: DC | PRN
  Administered 2020-09-24: 20 mg via INTRAVENOUS
  Administered 2020-09-24: 50 mg via INTRAVENOUS

## 2020-09-24 MED ORDER — METOPROLOL TARTRATE 5 MG/5ML IV SOLN
INTRAVENOUS | Status: DC | PRN
  Administered 2020-09-24: 2 mg via INTRAVENOUS

## 2020-09-24 MED ORDER — METOPROLOL TARTRATE 5 MG/5ML IV SOLN
INTRAVENOUS | Status: AC
  Filled 2020-09-24: qty 5

## 2020-09-24 MED ORDER — DEXAMETHASONE SODIUM PHOSPHATE 10 MG/ML IJ SOLN
INTRAMUSCULAR | Status: DC | PRN
  Administered 2020-09-24: 5 mg via INTRAVENOUS

## 2020-09-24 MED ORDER — HYDROCODONE-ACETAMINOPHEN 5-325 MG PO TABS: 1.0000 | ORAL_TABLET | Freq: Four times a day (QID) | ORAL | 0 refills | Status: DC | PRN

## 2020-09-24 MED ORDER — CHLORHEXIDINE GLUCONATE CLOTH 2 % EX PADS: 6.0000 | MEDICATED_PAD | Freq: Once | CUTANEOUS | Status: DC

## 2020-09-24 MED ORDER — DOCUSATE SODIUM 100 MG PO CAPS: 100.0000 mg | ORAL_CAPSULE | Freq: Two times a day (BID) | ORAL | 0 refills | Status: AC | PRN

## 2020-09-24 MED ORDER — CELECOXIB 200 MG PO CAPS: 200.0000 mg | ORAL_CAPSULE | ORAL | Status: AC

## 2020-09-24 MED ORDER — ONDANSETRON HCL 4 MG/2ML IJ SOLN
INTRAMUSCULAR | Status: DC | PRN
  Administered 2020-09-24: 4 mg via INTRAVENOUS

## 2020-09-24 MED ORDER — ACETAMINOPHEN 325 MG PO TABS: 650.0000 mg | ORAL_TABLET | Freq: Three times a day (TID) | ORAL | 0 refills | Status: AC | PRN

## 2020-09-24 MED ORDER — FENTANYL CITRATE (PF) 100 MCG/2ML IJ SOLN
25.0000 ug | INTRAMUSCULAR | Status: DC | PRN
  Administered 2020-09-24 (×3): 25 ug via INTRAVENOUS

## 2020-09-24 MED ORDER — BUPIVACAINE-EPINEPHRINE (PF) 0.5% -1:200000 IJ SOLN
INTRAMUSCULAR | Status: AC
  Filled 2020-09-24: qty 30

## 2020-09-24 MED ORDER — LIDOCAINE HCL (CARDIAC) PF 100 MG/5ML IV SOSY
PREFILLED_SYRINGE | INTRAVENOUS | Status: DC | PRN
  Administered 2020-09-24: 80 mg via INTRAVENOUS

## 2020-09-24 MED ORDER — FENTANYL CITRATE (PF) 100 MCG/2ML IJ SOLN
INTRAMUSCULAR | Status: DC | PRN
  Administered 2020-09-24 (×4): 25 ug via INTRAVENOUS

## 2020-09-24 MED ORDER — BUPIVACAINE LIPOSOME 1.3 % IJ SUSP
INTRAMUSCULAR | Status: DC | PRN
  Administered 2020-09-24: 20 mL

## 2020-09-24 MED ORDER — CEFAZOLIN SODIUM-DEXTROSE 2-4 GM/100ML-% IV SOLN
2.0000 g | INTRAVENOUS | Status: AC
  Administered 2020-09-24: 2 g via INTRAVENOUS

## 2020-09-24 MED ORDER — SUGAMMADEX SODIUM 200 MG/2ML IV SOLN
INTRAVENOUS | Status: DC | PRN
  Administered 2020-09-24: 200 mg via INTRAVENOUS

## 2020-09-24 MED ORDER — PROPOFOL 10 MG/ML IV BOLUS
INTRAVENOUS | Status: DC | PRN
  Administered 2020-09-24: 100 mg via INTRAVENOUS

## 2020-09-24 MED ORDER — ORAL CARE MOUTH RINSE: 15.0000 mL | Freq: Once | OROMUCOSAL | Status: AC

## 2020-09-24 MED ORDER — OXYCODONE HCL 5 MG/5ML PO SOLN
5.0000 mg | Freq: Once | ORAL | Status: DC | PRN
Start: 2020-09-24 — End: 2020-09-24

## 2020-09-24 MED ORDER — GABAPENTIN 300 MG PO CAPS
ORAL_CAPSULE | ORAL | Status: AC
  Administered 2020-09-24: 300 mg via ORAL
  Filled 2020-09-24: qty 1

## 2020-09-24 MED ORDER — CELECOXIB 200 MG PO CAPS
ORAL_CAPSULE | ORAL | Status: AC
  Administered 2020-09-24: 200 mg via ORAL
  Filled 2020-09-24: qty 1

## 2020-09-24 MED ORDER — CEFAZOLIN SODIUM-DEXTROSE 2-4 GM/100ML-% IV SOLN
INTRAVENOUS | Status: AC
  Filled 2020-09-24: qty 100

## 2020-09-24 MED ORDER — ONDANSETRON HCL 4 MG/2ML IJ SOLN: 4.0000 mg | Freq: Once | INTRAMUSCULAR | Status: AC

## 2020-09-24 MED ORDER — ACETAMINOPHEN 500 MG PO TABS
ORAL_TABLET | ORAL | Status: AC
  Administered 2020-09-24: 1000 mg via ORAL
  Filled 2020-09-24: qty 2

## 2020-09-24 SURGICAL SUPPLY — 54 items
"PENCIL ELECTRO HAND CTR " (MISCELLANEOUS) ×2 IMPLANT
BAG INFUSER PRESSURE 100CC (MISCELLANEOUS) IMPLANT
BLADE SURG SZ11 CARB STEEL (BLADE) ×3 IMPLANT
BNDG GAUZE ELAST 4 BULKY (GAUZE/BANDAGES/DRESSINGS) ×3 IMPLANT
CHLORAPREP W/TINT 26 (MISCELLANEOUS) ×3 IMPLANT
COVER TIP SHEARS 8 DVNC (MISCELLANEOUS) ×2 IMPLANT
COVER TIP SHEARS 8MM DA VINCI (MISCELLANEOUS) ×1
COVER WAND RF STERILE (DRAPES) ×3 IMPLANT
DEFOGGER SCOPE WARMER CLEARIFY (MISCELLANEOUS) ×3 IMPLANT
DERMABOND ADVANCED (GAUZE/BANDAGES/DRESSINGS) ×1
DERMABOND ADVANCED .7 DNX12 (GAUZE/BANDAGES/DRESSINGS) ×2 IMPLANT
DRAPE ARM DVNC X/XI (DISPOSABLE) ×6 IMPLANT
DRAPE COLUMN DVNC XI (DISPOSABLE) ×2 IMPLANT
DRAPE DA VINCI XI ARM (DISPOSABLE) ×3
DRAPE DA VINCI XI COLUMN (DISPOSABLE) ×1
ELECT CAUTERY BLADE 6.4 (BLADE) IMPLANT
ELECT REM PT RETURN 9FT ADLT (ELECTROSURGICAL) ×3
ELECTRODE REM PT RTRN 9FT ADLT (ELECTROSURGICAL) ×2 IMPLANT
GAUZE 4X4 16PLY ~~LOC~~+RFID DBL (SPONGE) ×3 IMPLANT
GLOVE SURG SYN 6.5 ES PF (GLOVE) ×12 IMPLANT
GLOVE SURG SYN 6.5 PF PI (GLOVE) ×4 IMPLANT
GLOVE SURG UNDER POLY LF SZ7 (GLOVE) ×8 IMPLANT
GOWN STRL REUS W/ TWL LRG LVL3 (GOWN DISPOSABLE) ×6 IMPLANT
GOWN STRL REUS W/TWL LRG LVL3 (GOWN DISPOSABLE) ×4
IRRIGATOR SUCT 8 DISP DVNC XI (IRRIGATION / IRRIGATOR) IMPLANT
IRRIGATOR SUCTION 8MM XI DISP (IRRIGATION / IRRIGATOR)
IV NS 1000ML (IV SOLUTION)
IV NS 1000ML BAXH (IV SOLUTION) IMPLANT
LABEL OR SOLS (LABEL) IMPLANT
MANIFOLD NEPTUNE II (INSTRUMENTS) ×3 IMPLANT
MESH 3DMAX 4X6 RT LRG (Mesh General) ×1 IMPLANT
MESH 3DMAX MID 4X6 RT LRG (Mesh General) IMPLANT
NDL INSUFFLATION 14GA 120MM (NEEDLE) ×2 IMPLANT
NEEDLE HYPO 22GX1.5 SAFETY (NEEDLE) ×3 IMPLANT
NEEDLE INSUFFLATION 14GA 120MM (NEEDLE) ×3 IMPLANT
OBTURATOR OPTICAL STANDARD 8MM (TROCAR) ×1
OBTURATOR OPTICAL STND 8 DVNC (TROCAR) ×2
OBTURATOR OPTICALSTD 8 DVNC (TROCAR) ×2 IMPLANT
PACK LAP CHOLECYSTECTOMY (MISCELLANEOUS) ×3 IMPLANT
PENCIL ELECTRO HAND CTR (MISCELLANEOUS) ×3 IMPLANT
SEAL CANN UNIV 5-8 DVNC XI (MISCELLANEOUS) ×6 IMPLANT
SEAL XI 5MM-8MM UNIVERSAL (MISCELLANEOUS) ×3
SET TUBE SMOKE EVAC HIGH FLOW (TUBING) ×3 IMPLANT
SOLUTION ELECTROLUBE (MISCELLANEOUS) ×3 IMPLANT
SPONGE T-LAP 4X18 ~~LOC~~+RFID (SPONGE) ×1 IMPLANT
SUT MNCRL 4-0 (SUTURE) ×1
SUT MNCRL 4-0 27XMFL (SUTURE) ×2
SUT VIC AB 2-0 SH 27 (SUTURE) ×1
SUT VIC AB 2-0 SH 27XBRD (SUTURE) ×2 IMPLANT
SUT VLOC 90 6 CV-15 VIOLET (SUTURE) ×6 IMPLANT
SUTURE MNCRL 4-0 27XMF (SUTURE) ×2 IMPLANT
SYR 30ML LL (SYRINGE) ×3 IMPLANT
TAPE TRANSPORE STRL 2 31045 (GAUZE/BANDAGES/DRESSINGS) ×3 IMPLANT
WATER STERILE IRR 500ML POUR (IV SOLUTION) ×3 IMPLANT

## 2020-09-24 NOTE — Discharge Instructions (Addendum)
Hernia repair, Care After This sheet gives you information about how to care for yourself after your procedure. Your health care provider may also give you more specific instructions. If you have problems or questions, contact your health care provider. What can I expect after the procedure? After your procedure, it is common to have the following: Pain in your abdomen, especially in the incision areas. You will be given medicine to control the pain. Tiredness. This is a normal part of the recovery process. Your energy level will return to normal over the next several weeks. Changes in your bowel movements, such as constipation or needing to go more often. Talk with your health care provider about how to manage this. Follow these instructions at home: Lutsen  tylenol and advil as needed for discomfort.  Please alternate between the two every four hours as needed for pain.    Use narcotics, if prescribed, only when tylenol and motrin is not enough to control pain.  325-'650mg'$  every 8hrs to max of '3000mg'$ /24hrs (including the '325mg'$  in every norco dose) for the tylenol.    Advil up to '800mg'$  per dose every 8hrs as needed for pain.   PLEASE RECORD NUMBER OF PILLS TAKEN UNTIL NEXT FOLLOW UP APPT.  THIS WILL HELP DETERMINE HOW READY YOU ARE TO BE RELEASED FROM ANY ACTIVITY RESTRICTIONS Do not drive or use heavy machinery while taking prescription pain medicine. Do not drink alcohol while taking prescription pain medicine.  Incision care    Follow instructions from your health care provider about how to take care of your incision areas. Make sure you: Keep your incisions clean and dry. Wash your hands with soap and water before and after applying medicine to the areas, and before and after changing your bandage (dressing). If soap and water are not available, use hand sanitizer. Change your dressing as told by your health care provider. Leave stitches (sutures), skin glue,  or adhesive strips in place. These skin closures may need to stay in place for 2 weeks or longer. If adhesive strip edges start to loosen and curl up, you may trim the loose edges. Do not remove adhesive strips completely unless your health care provider tells you to do that. Do not wear tight clothing over the incisions. Tight clothing may rub and irritate the incision areas, which may cause the incisions to open. Do not take baths, swim, or use a hot tub until your health care provider approves. OK TO SHOWER IN 24HRS.   Check your incision area every day for signs of infection. Check for: More redness, swelling, or pain. More fluid or blood. Warmth. Pus or a bad smell. Activity Avoid lifting anything that is heavier than 10 lb (4.5 kg) for 2 weeks or until your health care provider says it is okay. No pushing/pulling greater than 30lbs You may resume normal activities as told by your health care provider. Ask your health care provider what activities are safe for you. Take rest breaks during the day as needed. Eating and drinking Follow instructions from your health care provider about what you can eat after surgery. To prevent or treat constipation while you are taking prescription pain medicine, your health care provider may recommend that you: Drink enough fluid to keep your urine clear or pale yellow. Take over-the-counter or prescription medicines. Eat foods that are high in fiber, such as fresh fruits and vegetables, whole grains, and beans. Limit foods that are high in fat and processed sugars,  such as fried and sweet foods. General instructions Ask your health care provider when you will need an appointment to get your sutures or staples removed. Keep all follow-up visits as told by your health care provider. This is important. Contact a health care provider if: You have more redness, swelling, or pain around your incisions. You have more fluid or blood coming from the  incisions. Your incisions feel warm to the touch. You have pus or a bad smell coming from your incisions or your dressing. You have a fever. You have an incision that breaks open (edges not staying together) after sutures or staples have been removed. Get help right away if: You develop a rash. You have chest pain or difficulty breathing. You have pain or swelling in your legs. You feel light-headed or you faint. Your abdomen swells (becomes distended). You have nausea or vomiting. You have blood in your stool (feces). This information is not intended to replace advice given to you by your health care provider. Make sure you discuss any questions you have with your health care provider. Document Released: 07/22/2004 Document Revised: 09/21/2017 Document Reviewed: 10/04/2015 Elsevier Interactive Patient Education  2019 [place]   The drugs that you were given will stay in your system until tomorrow so for the next 24 hours you should not:  Drive an automobile Make any legal decisions Drink any alcoholic beverage   You may resume regular meals tomorrow.  Today it is better to start with liquids and gradually work up to solid foods.  You may eat anything you prefer, but it is better to start with liquids, then soup and crackers, and gradually work up to solid foods.   Please notify your doctor immediately if you have any unusual bleeding, trouble breathing, redness and pain at the surgery site, drainage, fever, or pain not relieved by medication.     Your post-operative visit with Dr.                                       is: Date:                        Time:    Please call to schedule your post-operative visit.  Additional Instructions:

## 2020-09-24 NOTE — Anesthesia Postprocedure Evaluation (Signed)
Anesthesia Post Note  Patient: Marland Mcalpine  Procedure(s) Performed: XI ROBOTIC ASSISTED INGUINAL HERNIA (Right) INSERTION OF MESH  Patient location during evaluation: PACU Anesthesia Type: General Level of consciousness: awake and alert and oriented Pain management: pain level controlled Vital Signs Assessment: post-procedure vital signs reviewed and stable Respiratory status: spontaneous breathing, nonlabored ventilation and respiratory function stable Cardiovascular status: blood pressure returned to baseline and stable Postop Assessment: no signs of nausea or vomiting Anesthetic complications: no   No notable events documented.   Last Vitals:  Vitals:   09/24/20 1445 09/24/20 1505  BP: 135/90   Pulse: 77 77  Resp: 14 16  Temp:    SpO2: 100% 100%    Last Pain:  Vitals:   09/24/20 1445  PainSc: 4                  Rajveer Handler

## 2020-09-24 NOTE — H&P (Signed)
No change to H&P.  Ok to proceed

## 2020-09-24 NOTE — Anesthesia Preprocedure Evaluation (Signed)
Anesthesia Evaluation  Patient identified by MRN, date of birth, ID band Patient awake    Reviewed: Allergy & Precautions, NPO status , Patient's Chart, lab work & pertinent test results  History of Anesthesia Complications Negative for: history of anesthetic complications  Airway Mallampati: III  TM Distance: >3 FB Neck ROM: full    Dental  (+) Missing   Pulmonary neg pulmonary ROS, neg shortness of breath,    Pulmonary exam normal        Cardiovascular Exercise Tolerance: Good hypertension, (-) angina+ CAD and + Past MI  (-) DOE Normal cardiovascular exam     Neuro/Psych PSYCHIATRIC DISORDERS negative neurological ROS  negative psych ROS   GI/Hepatic Neg liver ROS, hiatal hernia, GERD  Medicated and Controlled,  Endo/Other  diabetes, Type 2  Renal/GU      Musculoskeletal  (+) Arthritis ,   Abdominal   Peds  Hematology negative hematology ROS (+)   Anesthesia Other Findings Past Medical History: No date: Arthritis No date: CAD (coronary artery disease) No date: Depression No date: GERD (gastroesophageal reflux disease) No date: History of hiatal hernia No date: History of kidney stones No date: Hyperlipidemia No date: Hypertension No date: MDD (major depressive disorder) No date: Prostate cancer ([place]) 02/04/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     Comment:  4v; LIMA-LAD, SVG-PDA, SVG-D2, SVG-ramus intermedius 02/03/2007: ST elevation myocardial infarction (STEMI) of  anterolateral wall (HCC) No date: T2DM (type 2 diabetes mellitus) ([place]) 12/30/2008: Tubular adenoma of colon No date: Valvular heart disease     Comment:  a.) TTE on 11/18/2019 --> mild aortic, mitral, and               tricuspid valve regurgitation; EF 45%.  Past Surgical History: 02/03/2007: CARDIAC CATHETERIZATION; Left     Comment:  Location: [facility]; Surgeon: Pura Spice, MD 02/10/2010: CARDIAC CATHETERIZATION; Left     Comment:  Location: [facility]:  Surgeon: Isaias Cowman, MD 12/30/2008: COLONOSCOPY; N/A 03/20/2014: COLONOSCOPY; N/A 02/04/2007: CORONARY ARTERY BYPASS GRAFT; N/A     Comment:  4v CABG (LIMA-LAD, SVG-PDA, SVG-D2, SVG-ramus               intermedius); Location: [facility]; Surgeon: Durward Mallard, MD No date: HEMORRHOID SURGERY No date: PROSTATE SURGERY No date: TONSILLECTOMY  BMI    Body Mass Index: 26.61 kg/m      Reproductive/Obstetrics negative OB ROS                             Anesthesia Physical Anesthesia Plan  ASA: 3  Anesthesia Plan: General ETT   Post-op Pain Management:    Induction: Intravenous  PONV Risk Score and Plan: Ondansetron, Dexamethasone, Midazolam and Treatment may vary due to age or medical condition  Airway Management Planned: Oral ETT  Additional Equipment:   Intra-op Plan:   Post-operative Plan: Extubation in OR  Informed Consent: I have reviewed the patients History and Physical, chart, labs and discussed the procedure including the risks, benefits and alternatives for the proposed anesthesia with the patient or authorized representative who has indicated his/her understanding and acceptance.     Dental Advisory Given  Plan Discussed with: Anesthesiologist, CRNA and Surgeon  Anesthesia Plan Comments: (Patient consented for risks of anesthesia including but not limited to:  - adverse reactions to medications - damage to eyes, teeth, lips or other oral mucosa - nerve damage due to positioning  - sore throat or hoarseness - Damage to heart, brain, nerves,  lungs, other parts of body or loss of life  Patient voiced understanding.)        Anesthesia Quick Evaluation

## 2020-09-24 NOTE — Op Note (Signed)
Preoperative diagnosis: RIGHT inguinal Hernia.  Postoperative diagnosis: right indirect inguinal Hernia, reducible  Procedure: Robotic assisted laparoscopic right inguinal hernia repair with mesh  Anesthesia: General  Surgeon: Dr. Lysle Pearl  Wound Classification: Clean  Specimen: none  Complications: None  Estimated Blood Loss: 28m   Indications:  inguinal hernia. Repair was indicated to avoid complications of incarceration, obstruction and pain, and a prosthetic mesh repair was elected.  See H&P for further details.  Findings: 1. Vas Deferens and cord structures identified and preserved 2. Bard 3D max medium weight mesh used for repair 3. Adequate hemostasis achieved  Description of procedure: The patient was taken to the operating room. A time-out was completed verifying correct patient, procedure, site, positioning, and implant(s) and/or special equipment prior to beginning this procedure.  Area was prepped and draped in the usual sterile fashion. An incision was marked 20 cm above the pubic tubercle, slightly above the umbilicus. Veress needle inserted at palmer's point.  Saline drop test noted to be positive with gradual increase in pressure after initiation of gas insufflation.  15 mm of pressure was achieved prior to removing the Veress needle and then placing a 8 mm port via the Optiview technique through the supraumbilical site.  Inspection of the area afterwards noted no injury to the surrounding organs during insertion of the needle and the port.  2 port sites were marked 8 cm to the lateral sides of the initial port, and a 8 mm robotic port was placed on the left side, another 8 mm robotic port on the right side under direct supervision.  Local anesthesia  infused to the preplanned incision sites prior to insertion of the port.  The [place] then brought into the operative field and docked to the ports.  Examination of the abdominal cavity noted a right inguinal  hernia.  A peritoneal flap was created approximately 8cm cephalad to the defect by using scissors with electrocautery.  Dissection was carried down towards the pubic tubercle, developing the myopectineal orifice view.  Laterally the flap was carried towards the ASIS.  moderate hernia sac with lipoma was noted, which carefully dissected away from the adjacent tissues to be fully reduced out of hernia cavity.  Any bleeding was controlled with combination of electrocautery and manual pressure.    After confirming adequate dissection and the peritoneal reflection completely down and away from the cord structures, a Large Bard 3DMax medium weight mesh was placed within the anterior abdominal wall, secured in place using 2-0 Vicryl on an SH needle immediately above the pubic tubercle.  After noting proper placement of the mesh with the peritoneal reflection deep to it, the previously created peritoneal flap was secured back up to the anterior abdominal wall using running 3-0 V-Lock.  Small hole in the peritoneum around the excess sac was covered by the sac and sac secure to it using 2-0 vicryl. Both needles were then removed out of the abdominal cavity, Xi platform undocked from the ports and removed off of operative field.  exparel infused as ilioinguinal block.  Abdomen then desufflated and ports removed. All the skin incisions were then closed with a subcuticular stitch of Monocryl 4-0. Dermabond was applied. The testis was gently pulled down into its anatomic position in the scrotum.  The patient tolerated the procedure well and was taken to the postanesthesia care unit in stable condition. Sponge and instrument count correct at end of procedure.

## 2020-09-24 NOTE — Anesthesia Procedure Notes (Signed)
Procedure Name: Intubation Date/Time: 09/24/2020 12:49 PM Performed by: Lia Foyer, CRNA Pre-anesthesia Checklist: Patient identified, Emergency Drugs available, Suction available and Patient being monitored Patient Re-evaluated:Patient Re-evaluated prior to induction Oxygen Delivery Method: Circle system utilized Preoxygenation: Pre-oxygenation with 100% oxygen Induction Type: IV induction Ventilation: Mask ventilation without difficulty Laryngoscope Size: McGraph and 4 Grade View: Grade I Tube type: Oral Tube size: 7.0 mm Number of attempts: 1 Airway Equipment and Method: Stylet and Video-laryngoscopy Placement Confirmation: ETT inserted through vocal cords under direct vision, positive ETCO2 and breath sounds checked- equal and bilateral Secured at: 22 cm Tube secured with: Tape Dental Injury: Teeth and Oropharynx as per pre-operative assessment

## 2020-09-24 NOTE — Transfer of Care (Signed)
Immediate Anesthesia Transfer of Care Note  Patient: Adam Nelson  Procedure(s) Performed: XI ROBOTIC ASSISTED INGUINAL HERNIA (Right) INSERTION OF MESH  Patient Location: PACU  Anesthesia Type:General  Level of Consciousness: drowsy  Airway & Oxygen Therapy: Patient Spontanous Breathing and Patient connected to face mask oxygen  Post-op Assessment: Report given to RN and Post -op Vital signs reviewed and stable  Post vital signs: Reviewed and stable  Last Vitals:  Vitals Value Taken Time  BP 158/77 09/24/20 1427  Temp 36.7 C 09/24/20 1425  Pulse 78 09/24/20 1428  Resp 18 09/24/20 1428  SpO2 100 % 09/24/20 1428  Vitals shown include unvalidated device data.  Last Pain:  Vitals:   09/24/20 1425  PainSc: Asleep         Complications: No notable events documented.

## 2020-09-27 ENCOUNTER — Encounter: Payer: Self-pay | Admitting: Surgery

## 2020-09-29 ENCOUNTER — Encounter: Payer: Self-pay | Admitting: Surgery

## 2021-09-30 ENCOUNTER — Other Ambulatory Visit: Payer: Self-pay | Admitting: General Surgery

## 2021-09-30 NOTE — Progress Notes (Signed)
Progress Notes - documented in this encounter Legrande Hao, Geronimo Boot, MD - 09/29/2021 2:30 PM EDT Formatting of this note is different from the original. Images from the original note were not included. Subjective:   Patient ID: Adam Nelson is a 87 y.o. male.  HPI  The following portions of the patient's history were reviewed and updated as appropriate.  This an established patient is here today for: office visit. The patient is here today for evaluation of a recurrent right inguinal hernia. He had laparoscopic right inguinal hernia repair with Dr. Lysle Pearl in September of 2022.  The patient remains physically very active. Yard work, Risk manager, cutting trees. While he does not use firewood for heat, nor does he split wood, he is cutting down 15-18 inch diameter trees, loading him into a trailer and then bring him back to the house where others will pick them up for use.  The patient is accompanied by his daughter, Melody Haver.   Chief Complaint  Patient presents with  New Patient  Hernia  Eval recurrent hernia    BP (!) 149/65  Pulse 81  Temp 36.7 C (98.1 F)  Ht 162.6 cm (_0 )  Wt 66.2 kg (146 lb)  BMI 25.06 kg/m   Past Medical History:  Diagnosis Date  Arthritis  CAD (coronary artery disease)  Colon polyps 12/30/2008  Tubular Adenoma  Diabetes mellitus type 2, diet-controlled (CMS-HCC)  GERD (gastroesophageal reflux disease)  HTN (hypertension) 07/27/2013  Hyperlipidemia 07/27/2013  Kidney stones  Major depressive disorder, recurrent, mild (CMS-HCC) 07/08/2020  Myocardial infarction (CMS-HCC) 2009  Prostate cancer (CMS-HCC)  Type 2 diabetes mellitus with peripheral angiopathy (CMS-HCC)    Past Surgical History:  Procedure Laterality Date  PROSTATE SURGERY 2005  CORONARY ARTERY BYPASS GRAFT 01/2007  x4  COLONOSCOPY 12/30/2008  COLONOSCOPY 03/20/2014  PHx of tubular adenoma/Entire examined colon is normal/Repeat 63yr/PYO  LAPAROSCOPIC INGUINAL HERNIA  REPAIR Right 09/2020  robotic assisted by isami sakai  HEMORROIDECTOMY  TONSILLECTOMY AND ADENOIDECTOMY    Social History   Socioeconomic History  Marital status: Widowed  Tobacco Use  Smoking status: Never  Smokeless tobacco: Never  Vaping Use  Vaping Use: Never used  Substance and Sexual Activity  Alcohol use: Never  Alcohol/week: 0.0 standard drinks  Drug use: Never  Sexual activity: Not Currently  Partners: Female  Social History Narrative  [place] Family close.    Allergies  Allergen Reactions  Crestor [Rosuvastatin] Other (See Comments)  Transaminitis   Current Outpatient Medications  Medication Sig Dispense Refill  ANUCORT-HC 25 mg suppository INSERT 1 SUPPOSITORY TWICE DAILY AS NEEDED 20 suppository 0  ascorbic acid, vitamin C, (VITAMIN C) 1000 MG tablet Take 1,000 mg by mouth once daily  aspirin 81 MG EC tablet Take 81 mg by mouth once daily  cyanocobalamin (VITAMIN B12) 1000 MCG tablet Take 1,000 mcg by mouth once daily  diphenhydrAMINE (BENADRYL) 25 mg capsule Take 25 mg by mouth nightly as needed.  fluticasone propionate (FLONASE) 50 mcg/actuation nasal spray SPRAY 2 SPRAYS INTO EACH NOSTRIL ONCE DAILY 16 g 0  ibuprofen (MOTRIN) 400 MG tablet Take 1 tablet (400 mg total) by mouth 2 (two) times daily 30 tablet 0  loratadine (CLARITIN) 10 mg tablet Take 10 mg by mouth once daily as needed for Allergies Take 1/2 tablet by mouth daily  magnesium 200 mg Take by mouth Takes 7059mdaily  MULTIVITAMIN ORAL Take by mouth once daily.  nitroGLYcerin (NITROSTAT) 0.4 MG SL tablet Place 1 tablet (  0.4 mg total) under the tongue every 5 (five) minutes as needed for Chest pain May take up to 3 doses. 25 tablet 5  omeprazole (PRILOSEC) 40 MG DR capsule TAKE 1 CAPSULE BY MOUTH ONCE DAILY 90 capsule 3  pravastatin (PRAVACHOL) 40 MG tablet Take 1 tablet (40 mg total) by mouth at bedtime 30 tablet 11  vit B6-mag cit,oxid-potass cit 3.75-45-45-49.5 mg TbER  Take by mouth Take one tablet by mouth in the morning and one tablet in the evening.  vitamin E 200 UNIT capsule Take 200 Units by mouth once daily.  celecoxib (CELEBREX) 100 MG capsule TAKE 1 CAPSULE BY MOUTH TWICE DAILY (Patient not taking: Reported on 09/29/2021) 180 capsule 3   No current facility-administered medications for this visit.   Family History  Problem Relation Age of Onset  Alzheimer's disease Mother  High blood pressure (Hypertension) Father  Stroke Father  Leukemia Sister   Labs and Radiology:   September 22, 2021 laboratory:  Component Ref Range & Units 8 d ago  Glucose Random - [facility] 70 - 99 mg/dL 103 High  Blood Urea Nitrogen - [facility] 8 - 27 mg/dL 21  Creatinine - [facility] 0.76 - 1.27 mg/dL 0.95  EGFR (CKD-EPI 2021) - [facility] >59 mL/min/1.73 78  Bun/Creatinine Ratio - [facility] 10 - 24 22  Sodium - [facility] 134 - 144 mmol/L 140  Potassium - [facility] 3.5 - 5.2 mmol/L 4.9  Chloride - [facility] 96 - 106 mmol/L 103  Carbon Dioxide - [facility] 20 - 29 mmol/L 26  Calcium - [facility] 8.6 - 10.2 mg/dL 9.2  Protein Total - [facility] 6.0 - 8.5 g/dL 6.0  Albumin - [facility] 3.7 - 4.7 g/dL 4.0  Globulin, Total - [facility] 1.5 - 4.5 g/dL 2.0  A/G Ratio - [facility] 1.2 - 2.2 2.0  Bilirubin Total - [facility] 0.0 - 1.2 mg/dL 0.6  Alkaline Phosphatase - [facility] 44 - 121 IU/L 66  Ast - [facility] 0 - 40 IU/L 26  Alanine Aminotransferase - [facility] 0 - 44 IU/L 32   July 26, 2021 laboratory:  WBC (White Blood Cell Count) 4.1 - 10.2 10^3/uL 3.6 Low  RBC (Red Blood Cell Count) 4.69 - 6.13 10^6/uL 4.33 Low  Hemoglobin 14.1 - 18.1 gm/dL 13.6 Low  Hematocrit 40.0 - 52.0 % 39.0 Low  MCV (Mean Corpuscular Volume) 80.0 - 100.0 fl 90.1  MCH (Mean Corpuscular Hemoglobin) 27.0 - 31.2 pg 31.4 High  MCHC (Mean Corpuscular Hemoglobin Concentration) 32.0 - 36.0 gm/dL 34.9  Platelet Count 150 - 450 10^3/uL 121 Low  RDW-CV (Red Cell Distribution Width) 11.6 - 14.8 % 12.4  MPV (Mean Platelet Volume) 9.4 -  12.4 fl 9.9  Neutrophils 1.50 - 7.80 10^3/uL 2.08  Lymphocytes 1.00 - 3.60 10^3/uL 0.91 Low  Monocytes 0.00 - 1.50 10^3/uL 0.35  Eosinophils 0.00 - 0.55 10^3/uL 0.17  Basophils 0.00 - 0.09 10^3/uL 0.03  Neutrophil % 32.0 - 70.0 % 58.6  Lymphocyte % 10.0 - 50.0 % 25.6  Monocyte % 4.0 - 13.0 % 9.9  Eosinophil % 1.0 - 5.0 % 4.8  Basophil% 0.0 - 2.0 % 0.8  Immature Granulocyte % <=0.7 % 0.3  Immature Granulocyte Count <=0.06 10^3/L 0.01   Review of Systems  Constitutional: Negative for chills and fever.  Respiratory: Negative for cough.  Gastrointestinal: Negative.  Genitourinary: Negative for decreased urine volume, difficulty urinating and urgency.    Objective:  Physical Exam Constitutional:  Appearance: Normal appearance.  Cardiovascular:  Rate and Rhythm: Normal rate and regular rhythm.  Pulses: Normal pulses.  Heart sounds: Normal heart sounds.  Pulmonary:  Effort: Pulmonary effort is normal.  Breath sounds: Normal breath sounds.  Abdominal:  Hernia: A hernia is present. Hernia is present in the right inguinal area.  Genitourinary: Testes: Normal.   Comments: Moderate sized bulge in the right groin, reducible in the standing position. Left groin intact. Musculoskeletal:  Cervical back: Neck supple.  Neurological:  Mental Status: He is alert and oriented to person, place, and time.  Psychiatric:  Mood and Affect: Mood normal.  Behavior: Behavior normal.    Assessment:   Recurrent right inguinal hernia.  Plan:   Reviewed with the patient and his daughter that all hernia repair techniques have about the same recurrence rate, and usually if they recur it will be within the first 2 years. He reports that he was doing well until about a month ago, 11 months post surgery, when he became aware of a new bulge. This has modestly increased in size since discovery. No real pain.  Considering his active lifestyle, it seems prudent to undertake elective repair. Reviewed  anterior approach with the patient.  We had a long discussion that his generosity with wound is excellent, but perhaps others should be picking up to the sizable tree trunk portions for their use and he should limit himself to the limbs. Otherwise, the use of his backpack blower, weed [employer] are reasonable.   This note is partially prepared by Ledell Noss, CMA acting as a scribe in the presence of Dr. Hervey Ard, MD.   The documentation recorded by the scribe accurately reflects the service I personally performed and the decisions made by me.   Robert Bellow, MD FACS   Electronically signed by Mayer Masker, MD at 09/30/2021 7:40 AM EDT

## 2021-10-04 ENCOUNTER — Inpatient Hospital Stay
Admission: RE | Admit: 2021-10-04 | Discharge: 2021-10-04 | Disposition: A | Discharge status: Home / Self Care | Payer: Medicare Other | Source: Ambulatory Visit

## 2021-10-04 HISTORY — DX: Angina pectoris, unspecified: I20.9

## 2021-10-04 HISTORY — DX: Prediabetes: R73.03

## 2021-10-04 HISTORY — DX: Other specified postprocedural states: R11.2

## 2021-10-04 NOTE — Patient Instructions (Addendum)
Your procedure is scheduled on: 10/07/21 - Friday Report to the Registration Desk on the 1st floor of the [facility]. To find out your arrival time, please call [phone] between 1PM - 3PM on: 10/06/21 - Thursday If your arrival time is 6:00 am, do not arrive prior to that time as the [facility] entrance doors do not open until 6:00 am.  REMEMBER: Instructions that are not followed completely may result in serious medical risk, up to and including death; or upon the discretion of your surgeon and anesthesiologist your surgery may need to be rescheduled.  Do not eat food after midnight the night before surgery.  No gum chewing, lozengers or hard candies.  You may however, drink CLEAR liquids up to 2 hours before you are scheduled to arrive for your surgery. Do not drink anything within 2 hours of your scheduled arrival time. Type 1 and Type 2 diabetics should only drink water.  TAKE THESE MEDICATIONS THE MORNING OF SURGERY WITH A SIP OF WATER:  - omeprazole (PRILOSEC) 40 - (take one the night before and one on the morning of surgery - helps to prevent nausea after surgery.)  Stop Anti-inflammatories (NSAIDS) such as Advil, Aleve, Ibuprofen, Motrin, Naproxen, Naprosyn and Aspirin based products such as Excedrin, Goodys Powder, BC Powder.  Stop ANY OVER THE COUNTER supplements until after surgery.  You may however, continue to take Tylenol if needed for pain up until the day of surgery.  No Alcohol for 24 hours before or after surgery.  No Smoking including e-cigarettes for 24 hours prior to surgery.  No chewable tobacco products for at least 6 hours prior to surgery.  No nicotine patches on the day of surgery.  Do not use any "recreational" drugs for at least a week prior to your surgery.  Please be advised that the combination of cocaine and anesthesia may have negative outcomes, up to and including death. If you test positive for cocaine, your surgery will be cancelled.  On  the morning of surgery brush your teeth with toothpaste and water, you may rinse your mouth with mouthwash if you wish. Do not swallow any toothpaste or mouthwash.  Use CHG Soap or wipes as directed on instruction sheet.  Do not wear jewelry, make-up, hairpins, clips or nail polish.  Do not wear lotions, powders, or perfumes.   Do not shave body from the neck down 48 hours prior to surgery just in case you cut yourself which could leave a site for infection.  Also, freshly shaved skin may become irritated if using the CHG soap.  Contact lenses, hearing aids and dentures may not be worn into surgery.  Do not bring valuables to the hospital. [facility] is not responsible for any missing/lost belongings or valuables.   Notify your doctor if there is any change in your medical condition (cold, fever, infection).  Wear comfortable clothing (specific to your surgery type) to the hospital.  After surgery, you can help prevent lung complications by doing breathing exercises.  Take deep breaths and cough every 1-2 hours. Your doctor may order a device called an Incentive Spirometer to help you take deep breaths. When coughing or sneezing, hold a pillow firmly against your incision with both hands. This is called "splinting." Doing this helps protect your incision. It also decreases belly discomfort.  If you are being admitted to the hospital overnight, leave your suitcase in the car. After surgery it may be brought to your room.  If you are being  discharged the day of surgery, you will not be allowed to drive home. You will need a responsible adult (18 years or older) to drive you home and stay with you that night.   If you are taking public transportation, you will need to have a responsible adult (18 years or older) with you. Please confirm with your physician that it is acceptable to use public transportation.   Please call the [facility]. at [phone] if you have  any questions about these instructions.  Surgery Visitation Policy:  Patients undergoing a surgery or procedure may have two family members or support persons with them as long as the person is not COVID-19 positive or experiencing its symptoms.   Inpatient Visitation:    Visiting hours are 7 a.m. to 8 p.m. Up to four visitors are allowed at one time in a patient room, including children. The visitors may rotate out with other people during the day. One designated support person (adult) may remain overnight.

## 2021-10-05 ENCOUNTER — Other Ambulatory Visit: Payer: Self-pay

## 2021-10-05 ENCOUNTER — Encounter
Admission: RE | Admit: 2021-10-05 | Discharge: 2021-10-05 | Disposition: A | Discharge status: Home / Self Care | Payer: Medicare Other | Source: Ambulatory Visit | Attending: General Surgery | Admitting: General Surgery

## 2021-10-05 ENCOUNTER — Encounter: Payer: Self-pay | Admitting: General Surgery

## 2021-10-05 ENCOUNTER — Other Ambulatory Visit: Payer: Medicare Other

## 2021-10-05 NOTE — Progress Notes (Signed)
Perioperative Services  Pre-Admission/Anesthesia Testing Clinical Review  Date: 10/06/21  Patient Demographics:  Name: Adam Nelson DOB:   02-Jun-1934 MRN:   [id number]  Planned Surgical Procedure(s):    Case: [id number] Date/Time: 10/07/21 1032   Procedure: HERNIA REPAIR INGUINAL ADULT (Right)   Anesthesia type: General   Pre-op diagnosis: recurrent right inguinal hernia   Location: [facility] OR ROOM 09 / [place] [facility]   Surgeons: Robert Bellow, MD   NOTE: Available PAT nursing documentation and vital signs have been reviewed. Clinical nursing staff has updated patient's PMH/PSHx, current medication list, and drug allergies/intolerances to ensure comprehensive history available to assist in medical decision making as it pertains to the aforementioned surgical procedure and anticipated anesthetic course. Extensive review of available clinical information performed. [place] PMH and PSHx updated with any diagnoses/procedures that  may have been inadvertently omitted during his intake with the pre-admission testing department's nursing staff.  Clinical Discussion:  Adam Nelson is a 87 y.o. male who is submitted for pre-surgical anesthesia review and clearance prior to him undergoing the above procedure. Patient has never been a smoker. Pertinent PMH includes: CAD (s/p CABG), anterolateral STEMI, valvular heart disease, HTN, HLD, T2DM, GERD (on daily PPI), hiatal hernia, OA, depression.  Patient is followed by cardiology Saralyn Pilar, MD). He was last seen in the cardiology clinic on 05/18/2021; notes reviewed. At the time of his clinic visit, patient doing well overall from a cardiovascular perspective.  He denied any episodes of chest pain, shortness breath, PND, orthopnea, palpitations, significant peripheral edema, vertiginous symptoms, or presyncope/syncope.  PMH significant for cardiovascular diagnoses.  Patient suffered an anterolateral wall STEMI on 02/03/2007.  TTE  revealed moderately reduced left ventricular systolic function with an EF of 35%.  There was trivial mitral valve regurgitation.  No evidence of valvular stenosis.  Diagnostic left heart catheterization was performed revealing significant three-vessel CAD involving the RCA, LCx, and LAD.  LAD felt to be the culprit lesion/infarct related artery (IRA). Given three-vessel CAD and complex bifurcation LAD disease, patient was placed on an IABP and CVTS was consulted for consideration of CABG procedure.  Patient underwent four-vessel CABG procedure on 02/04/2007.  LIMA-LAD, SVG-PDA, SVG-D2, and SVG-ramus intermedius bypass grafts were placed.  Diagnostic left heart catheterization was performed on 02/10/2010 revealing a left ventricular systolic function of 81%.  There was moderate apical hypokinesis.  LIMA-LAD bypass graft patent.  SVG-PDA, SVG-DG1, and SVG-ramus intermedius bypass grafts all occluded.  There was 75% stenosis of the mid LAD and 70% stenosis of the RCA.  Further intervention was deferred opting for aggressive medical management.  Myocardial perfusion imaging study performed on 11/05/2019 revealed a moderately decreased left ventricular systolic function with an EF of 30-44%.  There were no ST or T wave changes noted during stress.  There was a small defect of moderate severity present in the apex location consistent with previous MI.  Study determined to be low risk.  TTE performed on 11/18/2019 revealed mild left ventricular systolic dysfunction with an EF of 45%.  There was mild aortic, mitral, and tricuspid valve regurgitation.  No evidence of valvular stenosis.  Blood pressure is well controlled at at 1288-64 mmHg without the use of pharmacological intervention.  Patient is on pravastatin for his HLD diagnosis and further ASCVD prevention.  T2DM controlled with diet lifestyle modification; last Hgb A1c was 5.8% when checked on 07/26/2021.  Functional capacity, as defined by DASI, is  documented as being >/= 4 METS.  No  changes were made to patient's medication regimen.  Patient to follow-up with outpatient cardiology in 6 months or sooner if needed.  Patient is scheduled to undergo an elective RIGHT INGUINAL HERNIA REPAIR on 10/07/2021 with Dr. Hervey Ard, MD. Given patient's past medical history significant for cardiovascular disease and intervention, presurgical cardiac clearance was sought by the PAT team. Per cardiology, "this patient is optimized for surgery and may proceed with the planned procedural course with a LOW risk of significant perioperative cardiovascular complications". This patient is on daily antiplatelet therapy.  He has been instructed on recommendations for holding his daily low-dose ASA for 5 days prior to his procedure with plans to restart as soon as postoperative bleeding risk not be minimized by his primary attending surgeon.  Patient is aware that his last dose of ASA should be on 10/01/2021.  Patient denies previous perioperative complications with anesthesia in the past.  In review of his EMR, it is noted that patient underwent a general anesthetic course [facility] at [facility] ([facility] and 09/2020 with no documented complications.     09/24/2020    4:34 PM 09/24/2020    3:35 PM 09/24/2020    3:15 PM  Vitals with BMI  Systolic [phone]  Diastolic 54 64 66  Pulse 71 80 75    Providers/Specialists:   NOTE: Primary physician provider listed below. Patient may have been seen by APP or partner within same practice.   PROVIDER ROLE / SPECIALTY LAST Desma Mcgregor, MD General Surgery 09/29/2021  Rusty Aus, MD Primary Care Provider 08/02/2021  Isaias Cowman, MD Cardiology 05/18/2021   Allergies:  Patient has no known allergies.  Current Home Medications:   No current facility-administered medications for this encounter.    Ascorbic Acid (VITAMIN C) 1000 MG tablet   aspirin EC 81 MG  tablet   celecoxib (CELEBREX) 100 MG capsule   fluticasone (FLONASE) 50 MCG/ACT nasal spray   hydrocortisone (ANUSOL-HC) 25 MG suppository   ibuprofen (ADVIL) 400 MG tablet   loratadine (CLARITIN) 10 MG tablet   MAGNESIUM PO   Multiple Vitamin (MULTIVITAMIN WITH MINERALS) TABS tablet   nitroGLYCERIN (NITROSTAT) 0.4 MG SL tablet   omeprazole (PRILOSEC) 40 MG capsule   pravastatin (PRAVACHOL) 10 MG tablet   pyridoxine (B-6) 100 MG tablet   vitamin B-12 (CYANOCOBALAMIN) 1000 MCG tablet   vitamin E 200 UNIT capsule   History:   Past Medical History:  Diagnosis Date   Anginal pain (HCC)    Arthritis    CAD (coronary artery disease) 02/03/2007   a.) STEMI 02/03/2007 --> LHC: 30% mRCA, 60% dRCA-1, 70% dRCA-2, 60% o-pRCA, 40% oLM, 40% LM, 30% pLCx, 20% mLCx, 70% OM2, 40% mLAD-1, 90% mLAD-2, 60% mLAD-3, 90% dLAD, 40% D1, 80% D2, 50% D3; b.) 4v CABG 02/04/2007; c.) LHC 02/10/2010: 70% mLAD, 70% dRCA, 100% SVG-PDA-DG1-RI   Depression    GERD (gastroesophageal reflux disease)    History of hiatal hernia    History of kidney stones    Hyperlipidemia    Hypertension    MDD (major depressive disorder)    PONV (postoperative nausea and vomiting)    Pre-diabetes    Prostate cancer (HCC)    S/P CABG x 4 02/04/2007   4v; LIMA-LAD, SVG-PDA, SVG-D2, SVG-ramus intermedius   ST elevation myocardial infarction (STEMI) of anterolateral wall ([place]) 02/03/2007   a.) LHC 01/18/209: 30% mRCA, 60% dRCA-1, 70% dRCA-2, 60% o-pRCA, 40% oLM, 40% LM, 30% pLCx,  20% mLCx, 70% OM2, 40% mLAD-1, 90% mLAD-2, 60% mLAD-3, 90% dLAD, 40% D1, 80% D2, 50% D3 - CVTS consult;  b.) 4v CABG 02/04/2007   Tubular adenoma of colon 12/30/2008   Valvular heart disease    a.) TTE on 11/18/2019 --> mild aortic, mitral, and tricuspid valve regurgitation; EF 45%.   Past Surgical History:  Procedure Laterality Date   CARDIAC CATHETERIZATION Left 02/03/2007   Location: [facility]; Surgeon: Pura Spice, MD   CARDIAC CATHETERIZATION Left  02/10/2010   Location: [facility]: Surgeon: Isaias Cowman, MD   COLONOSCOPY N/A 12/30/2008   COLONOSCOPY N/A 03/20/2014   CORONARY ARTERY BYPASS GRAFT N/A 02/04/2007   4v CABG (LIMA-LAD, SVG-PDA, SVG-D2, SVG-ramus intermedius); Location: [facility]; Surgeon: Durward Mallard, MD   HEMORRHOID SURGERY     INSERTION OF MESH  09/24/2020   Procedure: INSERTION OF MESH;  Surgeon: Benjamine Sprague, DO;  Location: [facility] ORS;  Service: General;;   PROSTATE SURGERY     TONSILLECTOMY     No family history on file. Social History   Tobacco Use   Smoking status: Never   Smokeless tobacco: Never  Substance Use Topics   Alcohol use: No   Drug use: No    Pertinent Clinical Results:  LABS: Labs reviewed: Acceptable for surgery.        ECG: Date: 09/23/2020 Rate: 78 bpm Rhythm: normal sinus Axis (leads I and aVF): Normal Intervals: PR 192 ms. QRS 74 ms. QTc 430 ms. ST segment and T wave changes: No evidence of acute ST segment elevation or depression Comparison: Similar to previous tracing obtained on 10/23/2019   IMAGING / PROCEDURES: TRANSTHORACIC ECHOCARDIOGRAM performed on 11/18/2019 LVEF 45% Mild left ventricular systolic dysfunction Normal right ventricular systolic function Mild AR, MR, and TR No PR No valvular stenosis No evidence of pericardial effusion  MYOCARDIAL PERFUSION IMAGING STUDY (LEXISCAN) performed on 11/05/2019 Moderately reduced left ventricular systolic function with an EF of 30-44% Normal myocardial thickening and wall motion Left ventricular cavity size normal Small defect of moderate severity present in the apex location consistent with prior myocardial infarction  No evidence of stress-induced cardiac arrhythmia  Low risk study  LEFT HEART CATHETERIZATION AND CORONARY ANGIOGRAPHY performed on 02/10/2010 Previous four-vessel CABG Patent LIMA-LAD graft 100% occlusion of the SVG-PDA graft 100% occlusion of the SVG-DG1 graft 100% occlusion of the SVG-ramus  intermedius graft 75% stenosis mid LAD 70% stenosis distal RCA Moderate apical hypokinesis LVEF calculated by contrast ventriculography was 49%  CORONARY ARTERY BYPASS GRAFTING performed on 02/04/2007 Four-vessel CABG procedure LIMA-LAD SVG-PDA SVG-D2 SVG-ramus intermedius  LEFT HEART CATHETERIZATION AND CORONARY ANGIOGRAPHY performed on 02/03/2007 Three-vessel CAD 30% stenosis of the mid RCA 60% stenosis of the distal RCA 70% stenosis of the distal RCA 60% stenosis of the ostial to proximal RCA 40% stenosis of the ostial LM 40% stenosis of the LM 30% stenosis of the proximal LCx  20% stenosis of the mid LCx 70% stenosis of the OM2 40% stenosis of the mid LAD  90% stenosis of the mid LAD 60% stenosis of the mid LAD 90% stenosis of the distal LAD 40% stenosis of D1 80% stenosis of D2 50% stenosis of D3 Infarct-related artery likely the LAD XB 3.5 did not seat and LMCA; used XB 3.0 guide to engage the LMCA Given 3 vessel CAD and complex bifurcation LAD disease, decision was made to place on IABP and consult CVTS for bypass evaluation.  Impression and Plan:  Adam Nelson has been referred for pre-anesthesia review and clearance prior  to him undergoing the planned anesthetic and procedural courses. Available labs, pertinent testing, and imaging results were personally reviewed by me.  Of note, patient has not had a recent ECG.  He will need to have tracing performed prior to surgery; order has been placed. This patient has been appropriately cleared by cardiology with an overall LOW risk of significant perioperative cardiovascular complications.  Based on clinical review performed today (10/06/21), barring any significant acute changes in the patient's overall condition, it is anticipated that he will be able to proceed with the planned surgical intervention. Any acute changes in clinical condition may necessitate his procedure being postponed and/or cancelled. Patient will meet  with anesthesia team (MD and/or CRNA) on the day of his procedure for preoperative evaluation/assessment. Questions regarding anesthetic course will be fielded at that time.   Pre-surgical instructions were reviewed with the patient during his PAT appointment and questions were fielded by PAT clinical staff. Patient was advised that if any questions or concerns arise prior to his procedure then he should return a call to PAT and/or his surgeon's office to discuss.  Honor Loh, MSN, APRN, FNP-C, CEN [facility]  Peri-operative Services Nurse Practitioner Phone: [phone] Fax: [phone] 10/06/21 10:32 AM  NOTE: This note has been prepared using Dragon dictation software. Despite my best ability to proofread, there is always the potential that unintentional transcriptional errors may still occur from this process.

## 2021-10-07 ENCOUNTER — Encounter: Payer: Self-pay | Admitting: General Surgery

## 2021-10-07 ENCOUNTER — Encounter
Admission: RE | Disposition: A | Discharge status: Home / Self Care | Payer: Self-pay | Source: Home / Self Care | Attending: General Surgery

## 2021-10-07 ENCOUNTER — Ambulatory Visit
Admission: RE | Admit: 2021-10-07 | Discharge: 2021-10-07 | Disposition: A | Discharge status: Home / Self Care | Payer: Medicare Other | Attending: General Surgery | Admitting: General Surgery

## 2021-10-07 ENCOUNTER — Ambulatory Visit: Payer: Medicare Other | Admitting: Urgent Care

## 2021-10-07 ENCOUNTER — Other Ambulatory Visit: Payer: Self-pay

## 2021-10-07 DIAGNOSIS — K4091 Unilateral inguinal hernia, without obstruction or gangrene, recurrent: Secondary | ICD-10-CM | POA: Diagnosis present

## 2021-10-07 DIAGNOSIS — I252 Old myocardial infarction: Secondary | ICD-10-CM | POA: Insufficient documentation

## 2021-10-07 DIAGNOSIS — F32A Depression, unspecified: Secondary | ICD-10-CM | POA: Diagnosis not present

## 2021-10-07 DIAGNOSIS — I10 Essential (primary) hypertension: Secondary | ICD-10-CM | POA: Diagnosis not present

## 2021-10-07 DIAGNOSIS — K219 Gastro-esophageal reflux disease without esophagitis: Secondary | ICD-10-CM | POA: Insufficient documentation

## 2021-10-07 DIAGNOSIS — Z79899 Other long term (current) drug therapy: Secondary | ICD-10-CM | POA: Diagnosis not present

## 2021-10-07 DIAGNOSIS — E785 Hyperlipidemia, unspecified: Secondary | ICD-10-CM | POA: Insufficient documentation

## 2021-10-07 DIAGNOSIS — I083 Combined rheumatic disorders of mitral, aortic and tricuspid valves: Secondary | ICD-10-CM | POA: Diagnosis not present

## 2021-10-07 DIAGNOSIS — Z87442 Personal history of urinary calculi: Secondary | ICD-10-CM | POA: Insufficient documentation

## 2021-10-07 DIAGNOSIS — M1990 Unspecified osteoarthritis, unspecified site: Secondary | ICD-10-CM | POA: Insufficient documentation

## 2021-10-07 DIAGNOSIS — I2510 Atherosclerotic heart disease of native coronary artery without angina pectoris: Secondary | ICD-10-CM | POA: Insufficient documentation

## 2021-10-07 DIAGNOSIS — Z951 Presence of aortocoronary bypass graft: Secondary | ICD-10-CM | POA: Diagnosis not present

## 2021-10-07 HISTORY — PX: INGUINAL HERNIA REPAIR: SHX194

## 2021-10-07 HISTORY — PX: INSERTION OF MESH: SHX5868

## 2021-10-07 SURGERY — REPAIR, HERNIA, INGUINAL, ADULT
Anesthesia: General | Laterality: Right

## 2021-10-07 MED ORDER — PROPOFOL 10 MG/ML IV BOLUS
INTRAVENOUS | Status: AC
  Filled 2021-10-07: qty 20

## 2021-10-07 MED ORDER — ONDANSETRON HCL 4 MG/2ML IJ SOLN: 4.0000 mg | Freq: Once | INTRAMUSCULAR | Status: DC | PRN

## 2021-10-07 MED ORDER — ACETAMINOPHEN 10 MG/ML IV SOLN
INTRAVENOUS | Status: AC
  Filled 2021-10-07: qty 100

## 2021-10-07 MED ORDER — LIDOCAINE HCL (CARDIAC) PF 100 MG/5ML IV SOSY
PREFILLED_SYRINGE | INTRAVENOUS | Status: DC | PRN
  Administered 2021-10-07: 60 mg via INTRAVENOUS
  Administered 2021-10-07: 40 mg via INTRAVENOUS

## 2021-10-07 MED ORDER — ACETAMINOPHEN 10 MG/ML IV SOLN
INTRAVENOUS | Status: DC | PRN
  Administered 2021-10-07: 1000 mg via INTRAVENOUS

## 2021-10-07 MED ORDER — FENTANYL CITRATE (PF) 100 MCG/2ML IJ SOLN: 25.0000 ug | INTRAMUSCULAR | Status: DC | PRN

## 2021-10-07 MED ORDER — BUPIVACAINE-EPINEPHRINE (PF) 0.5% -1:200000 IJ SOLN
INTRAMUSCULAR | Status: DC | PRN
  Administered 2021-10-07: 30 mL

## 2021-10-07 MED ORDER — FENTANYL CITRATE (PF) 100 MCG/2ML IJ SOLN
INTRAMUSCULAR | Status: AC
  Filled 2021-10-07: qty 2

## 2021-10-07 MED ORDER — HYDROCODONE-ACETAMINOPHEN 5-325 MG PO TABS: 1.0000 | ORAL_TABLET | ORAL | 0 refills | Status: AC | PRN

## 2021-10-07 MED ORDER — FENTANYL CITRATE (PF) 100 MCG/2ML IJ SOLN
INTRAMUSCULAR | Status: DC | PRN
  Administered 2021-10-07: 12.5 ug via INTRAVENOUS
  Administered 2021-10-07: 25 ug via INTRAVENOUS

## 2021-10-07 MED ORDER — CHLORHEXIDINE GLUCONATE CLOTH 2 % EX PADS
6.0000 | MEDICATED_PAD | Freq: Once | CUTANEOUS | Status: AC
  Administered 2021-10-07: 6 via TOPICAL

## 2021-10-07 MED ORDER — ONDANSETRON HCL 4 MG/2ML IJ SOLN
INTRAMUSCULAR | Status: DC | PRN
  Administered 2021-10-07: 4 mg via INTRAVENOUS

## 2021-10-07 MED ORDER — PROPOFOL 500 MG/50ML IV EMUL
INTRAVENOUS | Status: DC | PRN
  Administered 2021-10-07: 50 ug/kg/min via INTRAVENOUS

## 2021-10-07 MED ORDER — DEXAMETHASONE SODIUM PHOSPHATE 10 MG/ML IJ SOLN
INTRAMUSCULAR | Status: DC | PRN
  Administered 2021-10-07: 5 mg via INTRAVENOUS

## 2021-10-07 MED ORDER — CHLORHEXIDINE GLUCONATE 0.12 % MT SOLN
OROMUCOSAL | Status: AC
  Filled 2021-10-07: qty 15

## 2021-10-07 MED ORDER — BUPIVACAINE-EPINEPHRINE (PF) 0.5% -1:200000 IJ SOLN
INTRAMUSCULAR | Status: AC
  Filled 2021-10-07: qty 30

## 2021-10-07 MED ORDER — LACTATED RINGERS IV SOLN: INTRAVENOUS | Status: DC | PRN

## 2021-10-07 MED ORDER — PROPOFOL 10 MG/ML IV BOLUS
INTRAVENOUS | Status: DC | PRN
  Administered 2021-10-07: 30 mg via INTRAVENOUS
  Administered 2021-10-07: 20 mg via INTRAVENOUS
  Administered 2021-10-07: 100 mg via INTRAVENOUS

## 2021-10-07 MED ORDER — CEFAZOLIN SODIUM-DEXTROSE 2-4 GM/100ML-% IV SOLN
INTRAVENOUS | Status: AC
  Filled 2021-10-07: qty 100

## 2021-10-07 MED ORDER — LIDOCAINE HCL (PF) 2 % IJ SOLN
INTRAMUSCULAR | Status: AC
  Filled 2021-10-07: qty 5

## 2021-10-07 MED ORDER — CEFAZOLIN SODIUM-DEXTROSE 2-4 GM/100ML-% IV SOLN
2.0000 g | INTRAVENOUS | Status: AC
  Administered 2021-10-07: 2 g via INTRAVENOUS

## 2021-10-07 SURGICAL SUPPLY — 36 items
APL PRP STRL LF DISP 70% ISPRP (MISCELLANEOUS) ×2
BLADE SURG 15 STRL SS SAFETY (BLADE) ×4 IMPLANT
CHLORAPREP W/TINT 26 (MISCELLANEOUS) ×2 IMPLANT
DRAIN PENROSE 12X.25 LTX STRL (MISCELLANEOUS) ×2 IMPLANT
DRAPE LAPAROTOMY 100X77 ABD (DRAPES) ×2 IMPLANT
DRSG TEGADERM 4X4.75 (GAUZE/BANDAGES/DRESSINGS) ×2 IMPLANT
DRSG TELFA 3X4 N-ADH STERILE (GAUZE/BANDAGES/DRESSINGS) ×2 IMPLANT
ELECT REM PT RETURN 9FT ADLT (ELECTROSURGICAL) ×2
ELECTRODE REM PT RTRN 9FT ADLT (ELECTROSURGICAL) ×2 IMPLANT
GAUZE 4X4 16PLY ~~LOC~~+RFID DBL (SPONGE) ×2 IMPLANT
GLOVE BIO SURGEON STRL SZ7.5 (GLOVE) ×2 IMPLANT
GLOVE SURG UNDER LTX SZ8 (GLOVE) ×2 IMPLANT
GOWN STRL REUS W/ TWL LRG LVL3 (GOWN DISPOSABLE) ×4 IMPLANT
GOWN STRL REUS W/TWL LRG LVL3 (GOWN DISPOSABLE) ×4
KIT TURNOVER KIT A (KITS) ×2 IMPLANT
LABEL OR SOLS (LABEL) ×2 IMPLANT
MANIFOLD NEPTUNE II (INSTRUMENTS) ×2 IMPLANT
MESH MARLEX PLUG MEDIUM (Mesh General) IMPLANT
NEEDLE HYPO 22GX1.5 SAFETY (NEEDLE) ×4 IMPLANT
PACK BASIN MINOR ARMC (MISCELLANEOUS) ×2 IMPLANT
SPIKE FLUID TRANSFER (MISCELLANEOUS) ×2 IMPLANT
STRIP CLOSURE SKIN 1/2X4 (GAUZE/BANDAGES/DRESSINGS) ×2 IMPLANT
SUT PDS AB 0 CT1 27 (SUTURE) IMPLANT
SUT SURGILON 0 BLK (SUTURE) ×2 IMPLANT
SUT VIC AB 2-0 SH 27 (SUTURE) ×2
SUT VIC AB 2-0 SH 27XBRD (SUTURE) ×2 IMPLANT
SUT VIC AB 3-0 54X BRD REEL (SUTURE) ×2 IMPLANT
SUT VIC AB 3-0 BRD 54 (SUTURE) ×2
SUT VIC AB 3-0 SH 27 (SUTURE) ×2
SUT VIC AB 3-0 SH 27X BRD (SUTURE) ×2 IMPLANT
SUT VIC AB 4-0 FS2 27 (SUTURE) ×2 IMPLANT
SWABSTK COMLB BENZOIN TINCTURE (MISCELLANEOUS) ×2 IMPLANT
SYR 10ML LL (SYRINGE) ×2 IMPLANT
SYR 3ML LL SCALE MARK (SYRINGE) ×2 IMPLANT
TRAP FLUID SMOKE EVACUATOR (MISCELLANEOUS) ×2 IMPLANT
WATER STERILE IRR 500ML POUR (IV SOLUTION) ×2 IMPLANT

## 2021-10-07 NOTE — Op Note (Signed)
Preoperative diagnosis: Recurrent right inguinal hernia.  Postoperative diagnosis: Same.  Operative procedure: Open right inguinal hernia repair with medium Bard PerFix plug and patch.  Operating surgeon: Hervey Ard, MD.  Anesthesia: General by LMA, Marcaine 0.5% with 1: 200,000 units of epinephrine, 30 cc; Toradol: 30 mg.  Estimated blood loss: Less than 5 cc.  Clinical note: This 87 year old male underwent robotic repair of a right inguinal hernia in January.  He has developed a recurrence.  He is brought to the operative for planned repair.  He received Ancef prior to the procedure.  SCD stockings were in place for the procedure.  Hair was removed and the surgical site with clippers prior to presentation to the operating theater.  Operative note: The patient underwent general anesthesia and tolerated this well.  The abdomen was cleansed with ChloraPrep and draped.  The above-noted local anesthetic was used as a block for the inguinal area.  A 5 cm incision along the anticipated course the inguinal canal was made.  The skin was incised sharply and remaining dissection completed with electrocautery.  The external open was opened in the direction of its fibers.  The ileal inguinal nerve was identified.  The iliohypogastric nerve was not seen.  The cord was mobilized.  A generous indirect sac was noted.  This was dissected free to the level of the internal ring.  This was reduced and a medium Bard PerFix plug placed.  This was anchored to the iliopubic tract with 2-0 Surgilon sutures.  The onlay mesh was then placed.  Anchoring to the pubic tubercle and then along the inguinal ligament with interrupted 0 Surgilon sutures.  The medial and superior borders were anchored to the transverse abdominis aponeurosis.  The lateral slit was closed.  Toradol was placed in the wound.  The external oblique was closed with a running 2-0 Vicryl suture.  Scarpa's fascia was closed with a running 3-0 Vicryl suture.   The skin was closed with a running 4-0 Vicryl subcuticular suture.  Benzoin, Steri-Strips, Telfa and Tegaderm dressing was applied.  Patient tolerated procedure well was taken the PACU in stable condition.

## 2021-10-07 NOTE — H&P (Signed)
Adam Nelson [id number] Jan 01, 1935     HPI:  Patient with recurrent right inguinal hernia s/p robotic repair with Bard Max 3D mesh.. For anterior repair.    Medications Prior to Admission  Medication Sig Dispense Refill Last Dose   Ascorbic Acid (VITAMIN C) 1000 MG tablet Take 1,000 mg by mouth daily.      aspirin EC 81 MG tablet Take 81 mg by mouth daily.      celecoxib (CELEBREX) 100 MG capsule Take 100 mg by mouth 2 (two) times daily.      fluticasone (FLONASE) 50 MCG/ACT nasal spray Place 2 sprays into both nostrils daily as needed for allergies or rhinitis.      hydrocortisone (ANUSOL-HC) 25 MG suppository Place 25 mg rectally 2 (two) times daily as needed for hemorrhoids or anal itching.      ibuprofen (ADVIL) 400 MG tablet Take 1 tablet (400 mg total) by mouth every 8 (eight) hours as needed for mild pain or moderate pain. 20 tablet 0    loratadine (CLARITIN) 10 MG tablet Take 10 mg by mouth daily as needed for allergies.      MAGNESIUM PO Take 350 mg by mouth daily after supper.      Multiple Vitamin (MULTIVITAMIN WITH MINERALS) TABS tablet Take 1 tablet by mouth daily.      nitroGLYCERIN (NITROSTAT) 0.4 MG SL tablet Place 0.4 mg under the tongue every 5 (five) minutes as needed for chest pain.      omeprazole (PRILOSEC) 40 MG capsule Take 40 mg by mouth at bedtime.      pravastatin (PRAVACHOL) 10 MG tablet Take 40 mg by mouth daily.      pyridoxine (B-6) 100 MG tablet Take 100 mg by mouth in the morning and at bedtime.      vitamin B-12 (CYANOCOBALAMIN) 1000 MCG tablet Take 1,000 mcg by mouth daily.      vitamin E 200 UNIT capsule Take 200 Units by mouth daily.      No Known Allergies Past Medical History:  Diagnosis Date   Anginal pain ([place])    Arthritis    CAD (coronary artery disease) 02/03/2007   a.) STEMI 02/03/2007 --> LHC: 30% mRCA, 60% dRCA-1, 70% dRCA-2, 60% o-pRCA, 40% oLM, 40% LM, 30% pLCx, 20% mLCx, 70% OM2, 40% mLAD-1, 90% mLAD-2, 60% mLAD-3, 90% dLAD, 40% D1, 80%  D2, 50% D3; b.) 4v CABG 02/04/2007; c.) LHC 02/10/2010: 70% mLAD, 70% dRCA, 100% SVG-PDA-DG1-RI   Depression    GERD (gastroesophageal reflux disease)    History of hiatal hernia    History of kidney stones    Hyperlipidemia    Hypertension    MDD (major depressive disorder)    PONV (postoperative nausea and vomiting)    Pre-diabetes    Prostate cancer (HCC)    S/P CABG x 4 02/04/2007   4v; LIMA-LAD, SVG-PDA, SVG-D2, SVG-ramus intermedius   ST elevation myocardial infarction (STEMI) of anterolateral wall ([name]) 02/03/2007   a.) LHC 01/18/209: 30% mRCA, 60% dRCA-1, 70% dRCA-2, 60% o-pRCA, 40% oLM, 40% LM, 30% pLCx, 20% mLCx, 70% OM2, 40% mLAD-1, 90% mLAD-2, 60% mLAD-3, 90% dLAD, 40% D1, 80% D2, 50% D3 - CVTS consult;  b.) 4v CABG 02/04/2007   Tubular adenoma of colon 12/30/2008   Valvular heart disease    a.) TTE on 11/18/2019 --> mild aortic, mitral, and tricuspid valve regurgitation; EF 45%.   Past Surgical History:  Procedure Laterality Date   CARDIAC CATHETERIZATION Left 02/03/2007   Location: [facility];  Surgeon: Pura Spice, MD   CARDIAC CATHETERIZATION Left 02/10/2010   Location: [facility]: Surgeon: Isaias Cowman, MD   COLONOSCOPY N/A 12/30/2008   COLONOSCOPY N/A 03/20/2014   CORONARY ARTERY BYPASS GRAFT N/A 02/04/2007   4v CABG (LIMA-LAD, SVG-PDA, SVG-D2, SVG-ramus intermedius); Location: [facility]; Surgeon: Durward Mallard, MD   HEMORRHOID SURGERY     INSERTION OF MESH  09/24/2020   Procedure: INSERTION OF MESH;  Surgeon: Benjamine Sprague, DO;  Location: [facility] ORS;  Service: General;;   PROSTATE SURGERY     TONSILLECTOMY     Social History   Socioeconomic History   Marital status: Widowed    Spouse name: Not on file   Number of children: Not on file   Years of education: Not on file   Highest education level: Not on file  Occupational History   Not on file  Tobacco Use   Smoking status: Never   Smokeless tobacco: Never  Substance and Sexual Activity   Alcohol use: No   Drug  use: No   Sexual activity: Never  Other Topics Concern   Not on file  Social History Narrative   Lives daughter   Social Determinants of Health   Financial Resource Strain: Not on file  Food Insecurity: Not on file  Transportation Needs: Not on file  Physical Activity: Not on file  Stress: Not on file  Social Connections: Not on file  Intimate Partner Violence: Not on file   Social History   Social History Narrative   Lives daughter     ROS: Negative.     PE: HEENT: Negative. Lungs: Clear. Cardio: RR.   Assessment/Plan:  Proceed with planned repair of recurrent right inguinal hernia.   Forest Gleason [facility] 10/07/2021

## 2021-10-07 NOTE — Anesthesia Postprocedure Evaluation (Signed)
Anesthesia Post Note  Patient: Adam Nelson  Procedure(s) Performed: HERNIA REPAIR INGUINAL ADULT (Right) INSERTION OF MESH  Patient location during evaluation: PACU Anesthesia Type: General Level of consciousness: awake and awake and alert Pain management: pain level controlled Vital Signs Assessment: post-procedure vital signs reviewed and stable Respiratory status: respiratory function stable Cardiovascular status: stable Anesthetic complications: no   No notable events documented.   Last Vitals:  Vitals:   10/07/21 1200 10/07/21 1208  BP: (!) 131/58 (!) 153/67  Pulse: 74 69  Resp: 19 14  Temp: (!) 36.4 C 36.5 C  SpO2: 99% 99%    Last Pain:  Vitals:   10/07/21 1208  TempSrc: Temporal  PainSc: 0-No pain                 VAN STAVEREN,Chrystle Murillo

## 2021-10-07 NOTE — Transfer of Care (Signed)
Immediate Anesthesia Transfer of Care Note  Patient: AYSON CHERUBINI  Procedure(s) Performed: HERNIA REPAIR INGUINAL ADULT (Right) INSERTION OF MESH  Patient Location: PACU  Anesthesia Type:General  Level of Consciousness: drowsy  Airway & Oxygen Therapy: Patient Spontanous Breathing and Patient connected to face mask oxygen  Post-op Assessment: Report given to RN and Post -op Vital signs reviewed and stable  Post vital signs: Reviewed and stable  Last Vitals:  Vitals Value Taken Time  BP 118/51 10/07/21 1130  Temp 36.7 C 10/07/21 1130  Pulse 73 10/07/21 1133  Resp 12 10/07/21 1133  SpO2 100 % 10/07/21 1133  Vitals shown include unvalidated device data.  Last Pain:  Vitals:   10/07/21 0936  TempSrc: Temporal  PainSc: 0-No pain      Patients Stated Pain Goal: 0 (32/20/25 4270)  Complications: No notable events documented.

## 2021-10-07 NOTE — Discharge Instructions (Signed)

## 2021-10-07 NOTE — Anesthesia Preprocedure Evaluation (Signed)
Anesthesia Evaluation  Patient identified by MRN, date of birth, ID band Patient awake    Reviewed: Allergy & Precautions, NPO status , Patient's Chart, lab work & pertinent test results  Airway Mallampati: II  TM Distance: >3 FB Neck ROM: Limited    Dental  (+) Upper Dentures   Pulmonary neg pulmonary ROS,    Pulmonary exam normal breath sounds clear to auscultation       Cardiovascular Exercise Tolerance: Poor hypertension, + CAD, + Past MI, + CABG and + DOE  negative cardio ROS Normal cardiovascular exam Rhythm:Regular Rate:Normal     Neuro/Psych Depression negative neurological ROS  negative psych ROS   GI/Hepatic negative GI ROS, Neg liver ROS, hiatal hernia, GERD  Medicated,  Endo/Other  negative endocrine ROS  Renal/GU negative Renal ROS     Musculoskeletal  (+) Arthritis ,   Abdominal Normal abdominal exam  (+)   Peds negative pediatric ROS (+)  Hematology negative hematology ROS (+)   Anesthesia Other Findings Past Medical History: No date: Anginal pain (HCC) No date: Arthritis 02/03/2007: CAD (coronary artery disease)     Comment:  a.) STEMI 02/03/2007 --> LHC: 30% mRCA, 60% dRCA-1, 70%               dRCA-2, 60% o-pRCA, 40% oLM, 40% LM, 30% pLCx, 20% mLCx,               70% OM2, 40% mLAD-1, 90% mLAD-2, 60% mLAD-3, 90% dLAD,               40% D1, 80% D2, 50% D3; b.) 4v CABG 02/04/2007; c.) LHC               02/10/2010: 70% mLAD, 70% dRCA, 100% SVG-PDA-DG1-RI No date: Depression No date: GERD (gastroesophageal reflux disease) No date: History of hiatal hernia No date: History of kidney stones No date: Hyperlipidemia No date: Hypertension No date: MDD (major depressive disorder) No date: PONV (postoperative nausea and vomiting) No date: Pre-diabetes No date: Prostate cancer ([place]) 02/04/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     Comment:  4v; LIMA-LAD, SVG-PDA, SVG-D2, SVG-ramus intermedius 02/03/2007: ST elevation  myocardial infarction (STEMI) of  anterolateral wall (HCC)     Comment:  a.) LHC 01/18/209: 30% mRCA, 60% dRCA-1, 70% dRCA-2, 60%              o-pRCA, 40% oLM, 40% LM, 30% pLCx, 20% mLCx, 70% OM2, 40%              mLAD-1, 90% mLAD-2, 60% mLAD-3, 90% dLAD, 40% D1, 80% D2,              50% D3 - CVTS consult;  b.) 4v CABG 02/04/2007 12/30/2008: Tubular adenoma of colon No date: Valvular heart disease     Comment:  a.) TTE on 11/18/2019 --> mild aortic, mitral, and               tricuspid valve regurgitation; EF 45%.  Past Surgical History: 02/03/2007: CARDIAC CATHETERIZATION; Left     Comment:  Location: [facility]; Surgeon: Pura Spice, MD 02/10/2010: CARDIAC CATHETERIZATION; Left     Comment:  Location: [facility]: Surgeon: Isaias Cowman, MD 12/30/2008: COLONOSCOPY; N/A 03/20/2014: COLONOSCOPY; N/A 02/04/2007: CORONARY ARTERY BYPASS GRAFT; N/A     Comment:  4v CABG (LIMA-LAD, SVG-PDA, SVG-D2, SVG-ramus               intermedius); Location: [facility]; Surgeon: Durward Mallard, MD No date: HEMORRHOID SURGERY 09/24/2020: INSERTION OF MESH  Comment:  Procedure: INSERTION OF MESH;  Surgeon: Benjamine Sprague,               DO;  Location: [facility] ORS;  Service: General;; No date: PROSTATE SURGERY No date: TONSILLECTOMY  BMI    Body Mass Index: 24.89 kg/m      Reproductive/Obstetrics negative OB ROS                             Anesthesia Physical Anesthesia Plan  ASA: 3  Anesthesia Plan: General   Post-op Pain Management:    Induction: Intravenous  PONV Risk Score and Plan: Dexamethasone, Ondansetron, Midazolam and Treatment may vary due to age or medical condition  Airway Management Planned: LMA  Additional Equipment:   Intra-op Plan:   Post-operative Plan: Extubation in OR  Informed Consent: I have reviewed the patients History and Physical, chart, labs and discussed the procedure including the risks, benefits and alternatives for the proposed anesthesia with  the patient or authorized representative who has indicated his/her understanding and acceptance.     Dental Advisory Given  Plan Discussed with: CRNA and Surgeon  Anesthesia Plan Comments:         Anesthesia Quick Evaluation

## 2021-10-10 ENCOUNTER — Encounter: Payer: Self-pay | Admitting: General Surgery

## 2021-11-10 DIAGNOSIS — G2581 Restless legs syndrome: Secondary | ICD-10-CM | POA: Insufficient documentation

## 2022-05-18 ENCOUNTER — Ambulatory Visit: Payer: Medicare Other | Admitting: Dermatology

## 2022-05-18 ENCOUNTER — Encounter: Payer: Self-pay | Admitting: Dermatology

## 2022-05-18 DIAGNOSIS — D239 Other benign neoplasm of skin, unspecified: Secondary | ICD-10-CM

## 2022-05-18 DIAGNOSIS — D2262 Melanocytic nevi of left upper limb, including shoulder: Secondary | ICD-10-CM | POA: Diagnosis not present

## 2022-05-18 DIAGNOSIS — L570 Actinic keratosis: Secondary | ICD-10-CM | POA: Diagnosis not present

## 2022-05-18 DIAGNOSIS — W908XXA Exposure to other nonionizing radiation, initial encounter: Secondary | ICD-10-CM

## 2022-05-18 DIAGNOSIS — D492 Neoplasm of unspecified behavior of bone, soft tissue, and skin: Secondary | ICD-10-CM

## 2022-05-18 DIAGNOSIS — X32XXXA Exposure to sunlight, initial encounter: Secondary | ICD-10-CM

## 2022-05-18 DIAGNOSIS — L578 Other skin changes due to chronic exposure to nonionizing radiation: Secondary | ICD-10-CM

## 2022-05-18 DIAGNOSIS — D229 Melanocytic nevi, unspecified: Secondary | ICD-10-CM

## 2022-05-18 DIAGNOSIS — D2261 Melanocytic nevi of right upper limb, including shoulder: Secondary | ICD-10-CM

## 2022-05-18 HISTORY — DX: Other benign neoplasm of skin, unspecified: D23.9

## 2022-05-18 NOTE — Progress Notes (Signed)
New Patient Visit   Subjective  Adam Nelson is a 88 y.o. male who presents for the following: spots at face that he picks at, won't heal. No hx skin cancer. Patient advises his PCP has had to freeze some spots at ears.   The following portions of the chart were reviewed this encounter and updated as appropriate: medications, allergies, medical history  Review of Systems:  No other skin or systemic complaints except as noted in HPI or Assessment and Plan.  Objective  Well appearing patient in no apparent distress; mood and affect are within normal limits.   A focused examination was performed of the following areas: Face, scalp, ears, back, arms and hands  Relevant exam findings are noted in the Assessment and Plan.  L helix x 2, L temple x 3 (broad), L cheek x 1 (hypertrophic), R cheek x 1 (hypertrophic), chin x 1, R helix x 2 (10) Erythematous thin papules/macules with gritty scale.   left posterior shoulder 0.4 cm irregular medium to dark brown thin papule R/o Atypia     right posterior shoulder 0.25 cm slightly irregular medium to dark brown thin papule R/o Atypia       Assessment & Plan     AK (actinic keratosis) (10) L helix x 2, L temple x 3 (broad), L cheek x 1 (hypertrophic), R cheek x 1 (hypertrophic), chin x 1, R helix x 2  Actinic keratoses are precancerous spots that appear secondary to cumulative UV radiation exposure/sun exposure over time. They are chronic with expected duration over 1 year. A portion of actinic keratoses will progress to squamous cell carcinoma of the skin. It is not possible to reliably predict which spots will progress to skin cancer and so treatment is recommended to prevent development of skin cancer.  Recommend daily broad spectrum sunscreen SPF 30+ to sun-exposed areas, reapply every 2 hours as needed.  Recommend staying in the shade or wearing long sleeves, sun glasses (UVA+UVB protection) and wide brim hats (4-inch brim  around the entire circumference of the hat). Call for new or changing lesions.  Prior to procedure, discussed risks of blister formation, small wound, skin dyspigmentation, or rare scar following cryotherapy. Recommend Vaseline ointment to treated areas while healing.  Consider bx at right cheek if not resolved.   Destruction of lesion - L helix x 2, L temple x 3 (broad), L cheek x 1 (hypertrophic), R cheek x 1 (hypertrophic), chin x 1, R helix x 2  Destruction method: cryotherapy   Informed consent: discussed and consent obtained   Lesion destroyed using liquid nitrogen: Yes   Cryotherapy cycles:  2 Outcome: patient tolerated procedure well with no complications   Post-procedure details: wound care instructions given    Neoplasm of skin (2) left posterior shoulder  Epidermal / dermal shaving  Lesion diameter (cm):  0.4 Informed consent: discussed and consent obtained   Timeout: patient name, date of birth, surgical site, and procedure verified   Anesthesia: the lesion was anesthetized in a standard fashion   Anesthetic:  1% lidocaine w/ epinephrine 1-100,000 local infiltration Instrument used: DermaBlade   Hemostasis achieved with: aluminum chloride   Outcome: patient tolerated procedure well   Post-procedure details: wound care instructions given   Additional details:  Mupirocin and a bandage applied  Specimen 1 - Surgical pathology Differential Diagnosis: R/o Atypia  Check Margins: No 0.4 cm irregular medium to dark brown thin papule   right posterior shoulder  Epidermal / dermal shaving  Lesion diameter (cm):  0.3 Informed consent: discussed and consent obtained   Timeout: patient name, date of birth, surgical site, and procedure verified   Anesthesia: the lesion was anesthetized in a standard fashion   Anesthetic:  1% lidocaine w/ epinephrine 1-100,000 local infiltration Instrument used: flexible razor blade   Hemostasis achieved with: aluminum chloride    Outcome: patient tolerated procedure well   Post-procedure details: wound care instructions given   Additional details:  Mupirocin and a bandage applied  Specimen 2 - Surgical pathology Differential Diagnosis: R/o Atypia  Check Margins: No 0.25 cm slightly irregular medium to dark brown thin papule   ACTINIC DAMAGE - chronic, secondary to cumulative UV radiation exposure/sun exposure over time - diffuse scaly erythematous macules with underlying dyspigmentation - Recommend daily broad spectrum sunscreen SPF 30+ to sun-exposed areas, reapply every 2 hours as needed.  - Recommend staying in the shade or wearing long sleeves, sun glasses (UVA+UVB protection) and wide brim hats (4-inch brim around the entire circumference of the hat). - Call for new or changing lesions.  MELANOCYTIC NEVI - Tan-brown and/or pink-flesh-colored symmetric macules and papules - Benign appearing on exam today - Observation - Call clinic for new or changing moles - Recommend daily use of broad spectrum spf 30+ sunscreen to sun-exposed areas.   Return in about 3 months (around 08/18/2022) for TBSE, Hx AK with Dr. Katrinka Blazing.  Anise Salvo, RMA, am acting as scribe for Darden Dates, MD .   Documentation: I have reviewed the above documentation for accuracy and completeness, and I agree with the above.  Darden Dates, MD

## 2022-05-18 NOTE — Patient Instructions (Addendum)
Cryotherapy Aftercare  Wash gently with soap and water everyday.   Apply Vaseline and Band-Aid daily until healed.  Wound Care Instructions  Cleanse wound gently with soap and water once a day then pat dry with clean gauze. Apply a thin coat of Petrolatum (petroleum jelly, "Vaseline") over the wound (unless you have an allergy to this). We recommend that you use a new, sterile tube of Vaseline. Do not pick or remove scabs. Do not remove the yellow or white "healing tissue" from the base of the wound.  Cover the wound with fresh, clean, nonstick gauze and secure with paper tape. You may use Band-Aids in place of gauze and tape if the wound is small enough, but would recommend trimming much of the tape off as there is often too much. Sometimes Band-Aids can irritate the skin.  You should call the office for your biopsy report after 1 week if you have not already been contacted.  If you experience any problems, such as abnormal amounts of bleeding, swelling, significant bruising, significant pain, or evidence of infection, please call the office immediately.  FOR ADULT SURGERY PATIENTS: If you need something for pain relief you may take 1 extra strength Tylenol (acetaminophen) AND 2 Ibuprofen (200mg each) together every 4 hours as needed for pain. (do not take these if you are allergic to them or if you have a reason you should not take them.) Typically, you may only need pain medication for 1 to 3 days.      Due to recent changes in healthcare laws, you may see results of your pathology and/or laboratory studies on MyChart before the doctors have had a chance to review them. We understand that in some cases there may be results that are confusing or concerning to you. Please understand that not all results are received at the same time and often the doctors may need to interpret multiple results in order to provide you with the best plan of care or course of treatment. Therefore, we ask that you  please give us 2 business days to thoroughly review all your results before contacting the office for clarification. Should we see a critical lab result, you will be contacted sooner.   If You Need Anything After Your Visit  If you have any questions or concerns for your doctor, please call our main line at [phone] and press option 4 to reach your doctor's medical assistant. If no one answers, please leave a voicemail as directed and we will return your call as soon as possible. Messages left after 4 pm will be answered the following business day.   You may also send us a message via MyChart. We typically respond to MyChart messages within 1-2 business days.  For prescription refills, please ask your pharmacy to contact our office. Our fax number is [phone].  If you have an urgent issue when the clinic is closed that cannot wait until the next business day, you can page your doctor at the number below.    Please note that while we do our best to be available for urgent issues outside of office hours, we are not available 24/7.   If you have an urgent issue and are unable to reach us, you may choose to seek medical care at your doctor's office, retail clinic, urgent care center, or emergency room.  If you have a medical emergency, please immediately call 911 or go to the emergency department.  Pager Numbers  - Dr. Kowalski: [phone]  -   Dr. Neale Burly: [phone]  - Dr. Roseanne Reno: [phone]  In the event of inclement weather, please call our main line at [phone] for an update on the status of any delays or closures.  Dermatology Medication Tips: Please keep the boxes that topical medications come in in order to help keep track of the instructions about where and how to use these. Pharmacies typically print the medication instructions only on the boxes and not directly on the medication tubes.   If your medication is too expensive, please contact our office at  [phone] option 4 or send [place] a message through MyChart.   We are unable to tell what your co-pay for medications will be in advance as this is different depending on your insurance coverage. However, we may be able to find a substitute medication at lower cost or fill out paperwork to get insurance to cover a needed medication.   If a prior authorization is required to get your medication covered by your insurance company, please allow [place] 1-2 business days to complete this process.  Drug prices often vary depending on where the prescription is filled and some pharmacies may offer cheaper prices.  The website www.goodrx.com contains coupons for medications through different pharmacies. The prices here do not account for what the cost may be with help from insurance (it may be cheaper with your insurance), but the website can give you the price if you did not use any insurance.  - You can print the associated coupon and take it with your prescription to the pharmacy.  - You may also stop by our office during regular business hours and pick up a GoodRx coupon card.  - If you need your prescription sent electronically to a different pharmacy, notify our office through [facility] or by phone at [phone] option 4.

## 2022-05-24 ENCOUNTER — Encounter: Payer: Self-pay | Admitting: Dermatology

## 2022-05-24 DIAGNOSIS — I255 Ischemic cardiomyopathy: Secondary | ICD-10-CM | POA: Insufficient documentation

## 2022-05-25 ENCOUNTER — Telehealth: Payer: Self-pay

## 2022-05-25 NOTE — Telephone Encounter (Signed)
-----   Message from Sandi Mealy, MD sent at 05/25/2022  3:48 PM EDT ----- 1. 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 Skin , right posterior shoulder DYSPLASTIC COMPOUND NEVUS WITH MODERATE ATYPIA, IRRITATED, LIMITED MARGINS FREE --> recheck at f/u in August   This is a MODERATELY ATYPICAL MOLE. On the spectrum from normal mole to melanoma skin cancer, this is in between the two. - We need to recheck this area sometime in the next 6 months to be sure there is no evidence of the atypical mole coming back. If there is any color coming back, we would recommend repeating the biopsy to be sure the cells look normal.  - People who have a history of atypical moles do have a slightly increased risk of developing melanoma somewhere on the body, so a yearly full body skin exam by a dermatologist is recommended.  - Monthly self skin checks and daily sun protection are also recommended.  - Please call if you notice a dark spot coming back where this biopsy was taken.  - Please also call if you notice any new or changing spots anywhere else on the body before your follow-up visit.     MAs please call. Thank you!

## 2022-05-25 NOTE — Telephone Encounter (Signed)
Discussed pathology results with patient's daughter, voiced understanding. [facility] for recheck in August.

## 2022-08-16 DIAGNOSIS — D696 Thrombocytopenia, unspecified: Secondary | ICD-10-CM | POA: Insufficient documentation

## 2022-08-30 ENCOUNTER — Encounter: Payer: Medicare Other | Admitting: Dermatology

## 2023-04-04 DIAGNOSIS — F330 Major depressive disorder, recurrent, mild: Secondary | ICD-10-CM | POA: Insufficient documentation

## 2023-04-04 DIAGNOSIS — D61818 Other pancytopenia: Secondary | ICD-10-CM | POA: Insufficient documentation

## 2023-05-29 DIAGNOSIS — R4182 Altered mental status, unspecified: Secondary | ICD-10-CM | POA: Insufficient documentation

## 2023-06-07 ENCOUNTER — Other Ambulatory Visit: Payer: Self-pay | Admitting: Internal Medicine

## 2023-06-07 DIAGNOSIS — S32040S Wedge compression fracture of fourth lumbar vertebra, sequela: Secondary | ICD-10-CM

## 2023-06-07 NOTE — Progress Notes (Addendum)
 Chief Complaint: Patient was seen in consultation today for L4 compression fracture  Referring Physician(s): Miller,Mark F  History of Present Illness: Adam Nelson is a 89 y.o. male with a medical history significant for HTN, DM2, prostate cancer, CAD, MI, anxiety/depression, pancytopenia and kidney stones. He recently fell off a ladder and landed straight on his buttocks creating an L4 compression fracture. Depending on his body position, he has intermittent left leg numbness and weakness. He is taking tylenol  four times daily for the pain and he has experienced significant sundowning secondary to these changes. He has been referred to Interventional Radiology for an urgent consult for vertebral body augmentation.   He continues to experience significant pain in his lower back.  He is unable to ambulate well without a walker now.  He cannot tolerate opioid pain medication, and tylenol  is not helping much.  His pain is causing significant disability, scoring an 18 out of 24 on the [employer] disability questionnaire. His pain on average is a 7-8/10.  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     Past Medical History:  Diagnosis Date   Anginal pain (HCC)    Arthritis    CAD (coronary artery disease) 02/03/2007   a.) STEMI 02/03/2007 --> LHC: 30% mRCA, 60% dRCA-1, 70% dRCA-2, 60% o-pRCA, 40% oLM, 40% LM, 30% pLCx, 20% mLCx, 70% OM2, 40% mLAD-1, 90% mLAD-2, 60% mLAD-3, 90% dLAD, 40% D1, 80% D2, 50% D3; b.) 4v CABG 02/04/2007; c.) LHC 02/10/2010: 70% mLAD, 70% dRCA, 100% SVG-PDA-DG1-RI   Depression    Dysplastic nevus 05/18/2022   Left posterior shoulder. Moderate atypia, limited margins free.   Dysplastic nevus 05/18/2022   Right posterior shoulder. Moderate atypia, limited margins free.   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    Past Surgical History:  Procedure Laterality Date   CARDIAC CATHETERIZATION Left 02/03/2007   Location: [facility]; Surgeon: Toula French, MD   CARDIAC CATHETERIZATION Left 02/10/2010   Location: [facility]: Surgeon: Percival Brace, MD   COLONOSCOPY N/A 12/30/2008   COLONOSCOPY N/A 03/20/2014   CORONARY ARTERY BYPASS GRAFT N/A 02/04/2007   4v CABG (LIMA-LAD, SVG-PDA, SVG-D2, SVG-ramus intermedius); Location: [facility]; Surgeon: Forbes Ida, MD   HEMORRHOID SURGERY     INGUINAL HERNIA REPAIR Right 10/07/2021   Procedure: HERNIA REPAIR INGUINAL ADULT;  Surgeon: Marshall Skeeter, MD;  Location: [facility];  Service: General;  Laterality: Right;   INSERTION OF MESH  09/24/2020   Procedure: INSERTION OF MESH;  Surgeon: Conrado Delay, DO;  Location: [facility];  Service: General;;   INSERTION OF MESH  10/07/2021  Procedure: INSERTION OF MESH;  Surgeon: Marshall Skeeter, MD;  Location: [facility];  Service: General;;   PROSTATE SURGERY     TONSILLECTOMY      Allergies: Patient has no known allergies.  Medications: Prior to Admission medications   Medication Sig Start Date End Date Taking? Authorizing Provider  Ascorbic Acid (VITAMIN C) 1000 MG tablet Take 1,000 mg by mouth daily.    [provider]  aspirin EC 81 MG tablet Take 81 mg by mouth  daily.    [provider]  celecoxib  (CELEBREX ) 100 MG capsule Take 100 mg by mouth 2 (two) times daily.    [provider]  fluticasone (FLONASE) 50 MCG/ACT nasal spray Place 2 sprays into both nostrils daily as needed for allergies or rhinitis.    [provider]  hydrocortisone (ANUSOL-HC) 25 MG suppository Place 25 mg rectally 2 (two) times daily as needed for hemorrhoids or anal itching.    [provider]  loratadine (CLARITIN) 10 MG tablet Take 10 mg by mouth daily as needed for allergies.    [provider]  MAGNESIUM PO Take 350 mg by mouth daily after supper.    [provider]  Multiple Vitamin (MULTIVITAMIN WITH MINERALS) TABS tablet Take 1 tablet by mouth daily.    [provider]  nitroGLYCERIN (NITROSTAT) 0.4 MG SL tablet Place 0.4 mg under the tongue every 5 (five) minutes as needed for chest pain.    [provider]  omeprazole (PRILOSEC) 40 MG capsule Take 40 mg by mouth at bedtime.    [provider]  pravastatin (PRAVACHOL) 10 MG tablet Take 40 mg by mouth daily.    [provider]  pyridoxine (B-6) 100 MG tablet Take 100 mg by mouth in the morning and at bedtime.    [provider]  vitamin B-12 (CYANOCOBALAMIN) 1000 MCG tablet Take 1,000 mcg by mouth daily.    [provider]  vitamin E 200 UNIT capsule Take 200 Units by mouth daily.    [provider]     No family history on file.  Social History   Socioeconomic History   Marital status: Widowed    Spouse name: Not on file   Number of children: Not on file   Years of education: Not on file   Highest education level: Not on file  Occupational History   Not on file  Tobacco Use   Smoking status: Never   Smokeless tobacco: Never  Substance and Sexual Activity   Alcohol use: No   Drug use: No   Sexual activity: Never  Other Topics Concern   Not on file  Social History Narrative   Lives daughter    Social Drivers of Health   Financial Resource Strain: Low Risk  (04/01/2023)   Received [facility]   Overall Financial Resource Strain (CARDIA)    Difficulty of Paying Living Expenses: Not hard at all  Food Insecurity: No Food Insecurity (04/01/2023)   Received from [facility]   Hunger Vital Sign    Worried About Running Out of Food in the Last Year: Never true    Ran Out of Food in the Last Year: Never true  Transportation Needs: No Transportation Needs (04/01/2023)   Received from [facility] - Transportation    In the past 12 months, has lack of transportation kept you from medical appointments or from getting medications?: No    Lack of  Transportation (Non-Medical): No  Physical Activity: Not on file  Stress: Not on file  Social Connections: Not on file    Review of Systems: A 12 point ROS discussed and pertinent positives are indicated in the HPI above.  All other systems are negative.  Vital Signs: There were no vitals taken for this visit.  Advance Care Plan: The advanced care plan/surrogate decision maker was discussed at the time of visit and documented in the medical record.    Physical Exam Constitutional:      General: He is not in acute distress. HENT:     Head: Normocephalic.     Mouth/Throat:     Mouth: Mucous membranes are moist.  Eyes:     General: No scleral icterus. Cardiovascular:     Rate and Rhythm: Normal rate and regular rhythm.  Pulmonary:     Effort: No respiratory distress.  Abdominal:     General: There is no distension.  Musculoskeletal:       Arms:     Right lower leg: No edema.     Left lower leg: No edema.     Comments: Tender to palpation.  Skin:    General: Skin is warm and dry.     Coloration: Skin is not jaundiced.  Neurological:     Mental Status: He is alert.     Imaging: MRI Lumbar Spine 05/25/23  Acute/subacute L4 compression fracture with 30-35%  vertebral body height loss.  Labs:  CBC: No results for input(s): "WBC", "HGB", "HCT", "PLT" in the last 8760 hours.  COAGS: No results for input(s): "INR", "APTT" in the last 8760 hours.  BMP: No results for input(s): "NA", "K", "CL", "CO2", "GLUCOSE", "BUN", "CALCIUM", "CREATININE", "GFRNONAA", "GFRAA" in the last 8760 hours.  Invalid input(s): "CMP"  LIVER FUNCTION TESTS: No results for input(s): "BILITOT", "AST", "ALT", "ALKPHOS", "PROT", "ALBUMIN" in the last 8760 hours.  TUMOR MARKERS: No results for input(s): "AFPTM", "CEA", "CA199", "CHROMGRNA" in the last 8760 hours.  Assessment & Plan:   Patient has suffered acute  traumatic fracture of the L4 vertebra.   History and exam have demonstrated the following:  Acute/Subacute fracture by imaging dated 05/25/23, Pain on exam concordant with level of fracture, Inability to tolerate narcotic pain medication due to side effects, and Significant disability on the [employer] Disability Questionnaire with 18/24 positive symptoms, reflecting significant impact/impairment of (ADLs)   ICD-10-CM Codes that Support Medical Necessity ([email or web address]?[email or web address])  Group 4 codes (Traumatic Vertebral Fractures) - Select 1 below  and S32.040A    Wedge compression fracture of fourth lumbar vertebra, initial encounter for closed fracture    Plan:  L4 vertebral body augmentation with balloon kyphoplasty  Post-procedure disposition: outpatient [facility])  Medication holds: ASA - 5 days  The patient has suffered a fracture of the L4 vertebral body. It is recommended that patients aged 70 years or older be evaluated for possible testing or treatment of osteoporosis. A copy of this consult report is sent to the patient's referring physician.  Advanced Care Plan: The patient has an Advanced Care Plan or Surrogate Decision Maker documented in the EMR   Creasie Doctor, MD Pager:  [phone]    Total time spent on today's visit was over  40 Minutes, including both face-to-face time and non face-to-face time, personally spent on review of chart (including labs and relevant imaging), discussing further workup and treatment options, referral to specialist if needed, reviewing outside records if pertinent, answering patient questions, and coordinating care regarding  L4 compression fracture as well as management strategy.

## 2023-06-08 ENCOUNTER — Other Ambulatory Visit: Payer: Self-pay | Admitting: Interventional Radiology

## 2023-06-08 ENCOUNTER — Telehealth: Payer: Self-pay

## 2023-06-08 ENCOUNTER — Ambulatory Visit
Admission: RE | Admit: 2023-06-08 | Discharge: 2023-06-08 | Disposition: A | Discharge status: Home / Self Care | Source: Ambulatory Visit | Attending: Internal Medicine | Admitting: Internal Medicine

## 2023-06-08 VITALS — BP 169/77 | HR 85 | Temp 97.5°F | Resp 16

## 2023-06-08 DIAGNOSIS — S32040A Wedge compression fracture of fourth lumbar vertebra, initial encounter for closed fracture: Secondary | ICD-10-CM

## 2023-06-08 DIAGNOSIS — I255 Ischemic cardiomyopathy: Secondary | ICD-10-CM

## 2023-06-08 DIAGNOSIS — S32040S Wedge compression fracture of fourth lumbar vertebra, sequela: Secondary | ICD-10-CM

## 2023-06-08 DIAGNOSIS — D696 Thrombocytopenia, unspecified: Secondary | ICD-10-CM

## 2023-06-08 HISTORY — PX: IR RADIOLOGIST EVAL & MGMT: IMG5224

## 2023-06-12 NOTE — Discharge Instructions (Addendum)
 Discharge Instructions for Spinal Cement Augmentation  You may resume a regular diet and any medications that you routinely take (including pain medications). No driving the day of the procedure. Upon discharge go home and rest. Slowly increase your activities as tolerated. Do not lift anything heavier than a jug of milk for two weeks. You may use an ice pack as needed to injection sites on back. You may change the dressing on your back to bandaids tomorrow.  Change the bandaids daily until sites have healed. Follow up with your doctor in 2-3 weeks to let him know how you are feeling. Discuss with your doctor if bone-building therapy is appropriate for you, if you are not on this type of medication already.  You may resume your aspirin today.   Please contact our office at [phone] for the following symptoms or any questions:  Fever greater than 100 degrees Increased swelling, pain, or redness at injection site.   If you need to speak with someone after business hours, please call the on-call Interventionsal Radiologist at [phone]. Tell them you are Dr. Aleen Huron patient and that you had a Kyphoplasty today, andon with any issues you are having.    Thank you for visiting [facility]!

## 2023-06-14 NOTE — H&P (Signed)
 Chief Complaint: Patient was seen in consultation today for L4 compression fracture.   Referring Physician(s): Sari Cunning   Supervising Physician: Creasie Doctor  Patient Status: [name] - Outpatient   History of Present Illness: Adam Nelson is a 89 y.o. male with a medical history significant for HTN, DM2, prostate cancer, CAD, MI, anxiety/depression, pancytopenia and kidney stones. He recently fell off a ladder and landed straight on his buttocks creating an L4 compression fracture. Depending on his body position, he has intermittent left leg numbness and weakness. He is taking tylenol  four times daily for the pain and he has experienced significant sundowning secondary to these changes. He was referred to Interventional Radiology for an urgent consult for vertebral body augmentation and he met with Dr. Jinx Mourning 06/08/23.   The patient reported that he continues to experience significant pain in his lower back. He is unable to ambulate well without a walker now. He cannot tolerate opioid pain medication, and tylenol  is not helping much. His pain is causing significant disability, scoring an 18 out of 24 on the [employer] disability questionnaire. His pain on average is a 7-8/10.  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 Dr. Jinx Mourning discussed the risks, benefits and alternatives to vertebral body augmentation. The patient was agreeable to proceed.     Past Medical History:  Diagnosis Date   Anginal pain (HCC)    Arthritis    CAD (coronary artery disease) 02/03/2007   a.) STEMI 02/03/2007 --> LHC: 30% mRCA, 60% dRCA-1, 70% dRCA-2, 60% o-pRCA, 40% oLM, 40% LM, 30% pLCx, 20% mLCx, 70% OM2, 40% mLAD-1, 90% mLAD-2, 60% mLAD-3, 90% dLAD, 40% D1, 80% D2, 50% D3; b.) 4v CABG 02/04/2007; c.) LHC 02/10/2010: 70% mLAD, 70% dRCA, 100% SVG-PDA-DG1-RI   Depression    Dysplastic nevus 05/18/2022   Left posterior shoulder. Moderate atypia,  limited margins free.   Dysplastic nevus 05/18/2022   Right posterior shoulder. Moderate atypia, limited margins free.   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    Past Surgical History:  Procedure Laterality Date   CARDIAC CATHETERIZATION Left 02/03/2007   Location: [facility]; Surgeon: Toula French, MD   CARDIAC CATHETERIZATION Left 02/10/2010   Location: [facility]: Surgeon: Percival Brace, MD   COLONOSCOPY N/A 12/30/2008   COLONOSCOPY N/A 03/20/2014   CORONARY ARTERY BYPASS GRAFT N/A 02/04/2007   4v CABG (LIMA-LAD, SVG-PDA, SVG-D2, SVG-ramus intermedius); Location: [facility]; Surgeon: Forbes Ida, MD   HEMORRHOID SURGERY     INGUINAL HERNIA REPAIR Right 10/07/2021   Procedure: HERNIA REPAIR INGUINAL ADULT;  Surgeon: Marshall Skeeter, MD;  Location:  [facility];  Service: General;  Laterality: Right;   INSERTION OF MESH  09/24/2020   Procedure: INSERTION OF MESH;  Surgeon: Conrado Delay, DO;  Location: [facility] OR[facility];  Service: General;;   INSERTION OF MESH  10/07/2021   Procedure: INSERTION OF MESH;  Surgeon: Marshall Skeeter, MD;  Location: [facility] OR[facility];  Service: General;;   IR RADIOLOGIST EVAL & MGMT  06/08/2023   PROSTATE SURGERY     TONSILLECTOMY       Allergies: Rosuvastatin  Medications: Prior to Admission medications   Medication Sig Start Date End Date Taking? Authorizing Provider  Ascorbic Acid (VITAMIN C) 1000 MG tablet Take 1,000 mg by mouth daily.    [provider]  aspirin EC 81 MG tablet Take 81 mg by mouth daily.    [provider]  celecoxib  (CELEBREX ) 100 MG capsule Take 100 mg by mouth 2 (two) times daily.    [provider]  fluticasone (FLONASE) 50 MCG/ACT nasal spray Place 2 sprays into both nostrils daily as needed for allergies or rhinitis.    [provider]  hydrocortisone (ANUSOL-HC) 25 MG suppository Place 25 mg rectally 2 (two) times daily as needed for hemorrhoids or anal itching.    [provider]  loratadine (CLARITIN) 10 MG tablet Take 10 mg by mouth daily as needed for allergies.    [provider]  MAGNESIUM PO Take 350 mg by mouth daily after supper.    [provider]  Multiple Vitamin (MULTIVITAMIN WITH MINERALS) TABS tablet Take 1 tablet by mouth daily.    [provider]  nitroGLYCERIN (NITROSTAT) 0.4 MG SL tablet Place 0.4 mg under the tongue every 5 (five) minutes as needed for chest pain.    [provider]  omeprazole (PRILOSEC) 40 MG capsule Take 40 mg by mouth at bedtime.    [provider]  pravastatin (PRAVACHOL) 10 MG tablet Take 40 mg by mouth daily.    [provider]  pyridoxine (B-6) 100 MG tablet Take 100 mg by mouth in the morning and at bedtime.    [provider]  vitamin B-12 (CYANOCOBALAMIN) 1000 MCG tablet Take 1,000 mcg by mouth daily.    [provider]  vitamin E 200 UNIT capsule Take 200 Units by mouth daily.    [provider]     No family history on file.  Social History   Socioeconomic History   Marital status: Widowed    Spouse name: Not on file   Number of children: Not on file   Years of education: Not on file   Highest education level:  Not on file  Occupational History   Not on file  Tobacco Use   Smoking status: Never   Smokeless tobacco: Never  Substance and Sexual Activity   Alcohol use: No   Drug use: No   Sexual activity: Never  Other Topics Concern   Not on file  Social History Narrative   Lives daughter   Social Drivers of Health   Financial Resource Strain: Low Risk  (04/01/2023)   Received from [facility]   Overall Financial Resource Strain (CARDIA)    Difficulty of Paying Living Expenses: Not hard at all  Food Insecurity: No Food Insecurity (04/01/2023)   Received from [facility]   Hunger Vital Sign    Worried About Running Out of Food in the Last Year: Never true    Ran Out of Food in the Last Year: Never true  Transportation Needs: No Transportation Needs (04/01/2023)   Received from [facility] - Transportation    In the past 12 months, has lack of transportation kept you from medical appointments or from getting medications?: No    Lack of Transportation (Non-Medical): No  Physical Activity: Not on file  Stress: Not on file  Social Connections: Not on file    Review of Systems: A 12 point ROS discussed and pertinent positives are indicated in the HPI above.  All other systems are negative.  Review of Systems  Vital Signs: There were no vitals taken for this visit.  Physical Exam  Imaging: MRI Lumbar Spine 05/25/23  Acute/subacute L4 compression fracture with 30-35% vertebral body height loss.   Labs:  CBC: No results for input(s): "WBC", "HGB", "HCT", "PLT" in the last 8760 hours.  COAGS: No results for input(s): "INR", "APTT" in the last 8760 hours.  BMP: No results for input(s): "NA", "K", "CL", "CO2", "GLUCOSE", "BUN", "CALCIUM", "CREATININE", "GFRNONAA", "GFRAA" in the last 8760 hours.  Invalid input(s): "CMP"  LIVER FUNCTION TESTS: No results for input(s): "BILITOT", "AST", "ALT", "ALKPHOS", "PROT",  "ALBUMIN" in the last 8760 hours.  TUMOR MARKERS: No results for input(s): "AFPTM", "CEA", "CA199", "CHROMGRNA" in the last 8760 hours.  Assessment and Plan:  L4 compression fracture: Gavin Kast, 89 year old male, presents today for an image-guided L4 kyphoplasty/vertebroplasty.   Risks and benefits of L4 kyphoplasty/vertebroplasty were discussed with the patient including, but not limited to education regarding the natural healing process of compression fractures without intervention, bleeding, infection, cement migration which may cause spinal cord damage, paralysis, pulmonary embolism or even death.  All of the patient's questions were answered, patient is agreeable to proceed. He has been NPO.   Consent signed and in chart.  Thank you for this interesting consult.  I greatly enjoyed meeting ESHAAN TITZER and look forward to participating in their care.  A copy of this report was sent to the requesting provider on this date.  Electronically Signed: Jetta Morrow, AGACNP-BC 06/14/2023, 2:53 PM   I spent a total of  30 Minutes   in face to face in clinical consultation, greater than 50% of which was counseling/coordinating care for L4 compression fracture.

## 2023-06-15 ENCOUNTER — Ambulatory Visit
Admission: RE | Admit: 2023-06-15 | Discharge: 2023-06-15 | Disposition: A | Discharge status: Home / Self Care | Source: Ambulatory Visit | Attending: Interventional Radiology | Admitting: Interventional Radiology

## 2023-06-15 DIAGNOSIS — S32040A Wedge compression fracture of fourth lumbar vertebra, initial encounter for closed fracture: Secondary | ICD-10-CM

## 2023-06-15 HISTORY — PX: IR KYPHO LUMBAR INC FX REDUCE BONE BX UNI/BIL CANNULATION INC/IMAGING: IMG5519

## 2023-06-15 MED ORDER — ONDANSETRON HCL 4 MG/2ML IJ SOLN: 4.0000 mg | Freq: Once | INTRAMUSCULAR | Status: DC

## 2023-06-15 MED ORDER — MIDAZOLAM HCL 2 MG/2ML IJ SOLN
INTRAMUSCULAR | Status: DC | PRN
  Administered 2023-06-15: .5 mg via INTRAVENOUS
  Administered 2023-06-15 (×2): 1 mg via INTRAVENOUS
  Administered 2023-06-15: .5 mg via INTRAVENOUS
  Administered 2023-06-15: 1 mg via INTRAVENOUS

## 2023-06-15 MED ORDER — KETOROLAC TROMETHAMINE 30 MG/ML IJ SOLN
30.0000 mg | Freq: Once | INTRAMUSCULAR | Status: AC
  Administered 2023-06-15: 30 mg via INTRAVENOUS

## 2023-06-15 MED ORDER — FENTANYL CITRATE (PF) 100 MCG/2ML IJ SOLN
INTRAMUSCULAR | Status: DC | PRN
  Administered 2023-06-15: 50 ug via INTRAVENOUS
  Administered 2023-06-15 (×4): 25 ug via INTRAVENOUS

## 2023-06-15 MED ORDER — CEFAZOLIN SODIUM-DEXTROSE 2-4 GM/100ML-% IV SOLN
2.0000 g | INTRAVENOUS | Status: AC
  Administered 2023-06-15: 2 g via INTRAVENOUS

## 2023-06-15 MED ORDER — MIDAZOLAM HCL 5 MG/5ML IJ SOLN: INTRAMUSCULAR | Status: DC | PRN

## 2023-06-15 MED ORDER — LIDOCAINE-EPINEPHRINE 1 %-1:100000 IJ SOLN
20.0000 mL | Freq: Once | INTRAMUSCULAR | Status: AC
  Administered 2023-06-15: 20 mL via INTRADERMAL

## 2023-06-15 MED ORDER — FENTANYL CITRATE PF 50 MCG/ML IJ SOSY: 25.0000 ug | PREFILLED_SYRINGE | INTRAMUSCULAR | Status: DC | PRN

## 2023-06-15 MED ORDER — SODIUM CHLORIDE 0.9 % IV SOLN
Freq: Once | INTRAVENOUS | Status: AC
  Administered 2023-06-15: 250 mL via INTRAVENOUS

## 2023-06-15 MED ORDER — MIDAZOLAM HCL 2 MG/2ML IJ SOLN: 1.0000 mg | INTRAMUSCULAR | Status: DC | PRN

## 2023-06-15 NOTE — Progress Notes (Signed)
Pt back in nursing recovery area. Pt still drowsy from procedure but will wake up when spoken to. Pt follows commands, talks in complete sentences and has no complaints at this time. Pt will remain in nursing station until discharge.  ?

## 2023-06-18 ENCOUNTER — Telehealth: Payer: Self-pay

## 2023-06-18 NOTE — Telephone Encounter (Signed)
 Spoke with patient's daughter to see how he is doing after L2 KP here 06/15/23.  She reports he has absolutely no back pain and that physical therapy cam to day to help him rebuild strength in his legs. She says she is very grateful for all we did for them!

## 2023-06-27 ENCOUNTER — Other Ambulatory Visit: Payer: Self-pay | Admitting: Interventional Radiology

## 2023-06-27 DIAGNOSIS — S32020A Wedge compression fracture of second lumbar vertebra, initial encounter for closed fracture: Secondary | ICD-10-CM

## 2023-07-10 NOTE — Progress Notes (Signed)
 This encounter was conducted via the [employer] providing interactive audio and visual communication.  The patient provided verbal consent to conduct a virtual appointment.  The patient was located at their primary residence during this encounter.  Referring Physician(s): Miller,Mark F   Chief Complaint: The patient is seen in virtual video follow up today s/p L4 kyphoplasty  History of present illness: HPI from initial consultation 06/08/23 Adam Nelson is a 89 y.o. male with a medical history significant for HTN, DM2, prostate cancer, CAD, MI, anxiety/depression, pancytopenia and kidney stones. He recently fell off a ladder and landed straight on his buttocks creating an L4 compression fracture. Depending on his body position, he has intermittent left leg numbness and weakness. He is taking tylenol  four times daily for the pain and he has experienced significant sundowning secondary to these changes. He has been referred to Interventional Radiology for an urgent consult for vertebral body augmentation.    He continues to experience significant pain in his lower back.  He is unable to ambulate well without a walker now.  He cannot tolerate opioid pain medication, and tylenol  is not helping much.  His pain is causing significant disability, scoring an 18 out of 24 on the [employer] disability questionnaire. His pain on average is a 7-8/10.  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  We discussed the risks, benefits, alternatives and procedure expectations of vertebral body augmentation. He was agreeable to proceed and this was performed 06/15/23. He tolerated the procedure well and he presents today via virtual video visit for follow up.   Past Medical History:  Diagnosis Date   Anginal pain (HCC)    Arthritis    CAD (coronary artery disease) 02/03/2007   a.) STEMI 02/03/2007 --> LHC: 30% mRCA, 60% dRCA-1, 70% dRCA-2, 60% o-pRCA, 40%  oLM, 40% LM, 30% pLCx, 20% mLCx, 70% OM2, 40% mLAD-1, 90% mLAD-2, 60% mLAD-3, 90% dLAD, 40% D1, 80% D2, 50% D3; b.) 4v CABG 02/04/2007; c.) LHC 02/10/2010: 70% mLAD, 70% dRCA, 100% SVG-PDA-DG1-RI   Depression    Dysplastic nevus 05/18/2022   Left posterior shoulder. Moderate atypia, limited margins free.   Dysplastic nevus 05/18/2022   Right posterior shoulder. Moderate atypia, limited margins free.   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    Past Surgical History:  Procedure Laterality Date   CARDIAC CATHETERIZATION Left 02/03/2007   Location: [facility]; Surgeon: Ozell Estelle, MD   CARDIAC CATHETERIZATION Left 02/10/2010   Location: [facility]: Surgeon: Marsa Dooms, MD   COLONOSCOPY N/A 12/30/2008   COLONOSCOPY N/A 03/20/2014   CORONARY ARTERY BYPASS GRAFT  N/A 02/04/2007   4v CABG (LIMA-LAD, SVG-PDA, SVG-D2, SVG-ramus intermedius); Location: [facility]; Surgeon: Reyes Fruits, MD   HEMORRHOID SURGERY     INGUINAL HERNIA REPAIR Right 10/07/2021   Procedure: HERNIA REPAIR INGUINAL ADULT;  Surgeon: Dessa Reyes ORN, MD;  Location: [facility];  Service: General;  Laterality: Right;   INSERTION OF MESH  09/24/2020   Procedure: INSERTION OF MESH;   Surgeon: Tye Millet, DO;  Location: [facility];  Service: General;;   INSERTION OF MESH  10/07/2021   Procedure: INSERTION OF MESH;  Surgeon: Dessa Reyes ORN, MD;  Location: [facility];  Service: General;;   IR KYPHO LUMBAR INC FX REDUCE BONE BX UNI/BIL CANNULATION INC/IMAGING  06/15/2023   IR RADIOLOGIST EVAL & MGMT  06/08/2023   PROSTATE SURGERY     TONSILLECTOMY      Allergies: Rosuvastatin  Medications: Prior to Admission medications   Medication Sig Start Date End Date Taking? Authorizing Provider  Ascorbic Acid (VITAMIN C) 1000 MG tablet Take 1,000 mg by mouth daily.    [provider]  aspirin EC 81 MG tablet Take 81 mg by mouth daily.    [provider]  celecoxib  (CELEBREX ) 100 MG capsule Take 100 mg by mouth 2 (two) times daily.    [provider]  fluticasone (FLONASE) 50 MCG/ACT nasal spray Place 2 sprays into both nostrils daily as needed for allergies or rhinitis.    [provider]  hydrocortisone (ANUSOL-HC) 25 MG suppository Place 25 mg rectally 2 (two) times daily as needed for hemorrhoids or anal itching.    [provider]  loratadine (CLARITIN) 10 MG tablet Take 10 mg by mouth daily as needed for allergies.    [provider]  MAGNESIUM PO Take 350 mg by mouth daily after supper.    [provider]  Multiple Vitamin (MULTIVITAMIN WITH MINERALS) TABS tablet Take 1 tablet by mouth daily.    [provider]  nitroGLYCERIN (NITROSTAT) 0.4 MG SL tablet Place 0.4 mg under the tongue every 5 (five) minutes as needed for chest pain.    [provider]  omeprazole (PRILOSEC) 40 MG capsule Take 40 mg by mouth at bedtime.    [provider]  pravastatin (PRAVACHOL) 10 MG tablet Take 40 mg by mouth daily.    [provider]  pyridoxine (B-6) 100 MG tablet Take 100 mg by mouth in the morning and at bedtime.    [provider]  vitamin B-12 (CYANOCOBALAMIN) 1000 MCG tablet Take  1,000 mcg by mouth daily.    [provider]  vitamin E 200 UNIT capsule Take 200 Units by mouth daily.    [provider]     No family history on file.  Social History   Socioeconomic History   Marital status: Widowed    Spouse name: Not on file   Number of children: Not on file   Years of education: Not on file   Highest education level: Not on file  Occupational History   Not on file  Tobacco Use   Smoking status: Never   Smokeless tobacco: Never  Substance and Sexual Activity   Alcohol use: No   Drug use: No   Sexual activity: Never  Other Topics Concern   Not on file  Social History Narrative   Lives daughter   Social Drivers of Health   Financial Resource Strain: Low Risk  (07/09/2023)   Received from [facility]   Overall Financial Resource Strain (CARDIA)  Difficulty of Paying Living Expenses: Not hard at all  Food Insecurity: No Food Insecurity (07/09/2023)   Received from [facility]   Hunger Vital Sign    Within the past 12 months, you worried that your food would run out before you got the money to buy more.: Never true    Within the past 12 months, the food you bought just didn't last and you didn't have money to get more.: Never true  Transportation Needs: No Transportation Needs (07/09/2023)   Received from [facility] - Transportation    In the past 12 months, has lack of transportation kept you from medical appointments or from getting medications?: No    Lack of Transportation (Non-Medical): No  Physical Activity: Not on file  Stress: Not on file  Social Connections: Not on file     Vital Signs: There were no vitals taken for this visit.  Physical Exam  Patient is alert, oriented and able to participate fully in the conversation. No apparent discomfort or distress observed. He appears appropriately dressed.    Imaging: MRI Lumbar Spine 05/25/23  Acute/subacute  L4 compression fracture with 30-35% vertebral body height loss.  Labs:  CBC: No results for input(s): WBC, HGB, HCT, PLT in the last 8760 hours.  COAGS: No results for input(s): INR, APTT in the last 8760 hours.  BMP: No results for input(s): NA, K, CL, CO2, GLUCOSE, BUN, CALCIUM, CREATININE, GFRNONAA, GFRAA in the last 8760 hours.  Invalid input(s): CMP  LIVER FUNCTION TESTS: No results for input(s): BILITOT, AST, ALT, ALKPHOS, PROT, ALBUMIN in the last 8760 hours.  Assessment and Plan:  89 year old male with a history of acute, traumatic fracture of the L4 vertebrae. He underwent a technically successful vertebral body augmentation with balloon kyphoplasty 06/15/23.   Ester Sides, MD Pager: [phone]    I spent a total of 25 Minutes in virtual video clinical consultation, greater than 50% of which was counseling/coordinating care for L4 compression fracture

## 2023-07-11 ENCOUNTER — Ambulatory Visit
Admission: RE | Admit: 2023-07-11 | Discharge: 2023-07-11 | Disposition: A | Discharge status: Home / Self Care | Source: Ambulatory Visit | Attending: Interventional Radiology

## 2023-07-11 DIAGNOSIS — S32020A Wedge compression fracture of second lumbar vertebra, initial encounter for closed fracture: Secondary | ICD-10-CM

## 2023-07-11 HISTORY — PX: IR RADIOLOGIST EVAL & MGMT: IMG5224

## 2023-12-27 ENCOUNTER — Encounter: Attending: Neurology | Admitting: Dietician

## 2023-12-27 ENCOUNTER — Encounter: Payer: Self-pay | Admitting: Dietician

## 2023-12-27 VITALS — Ht 64.0 in | Wt 130.3 lb

## 2023-12-27 DIAGNOSIS — Z713 Dietary counseling and surveillance: Secondary | ICD-10-CM | POA: Insufficient documentation

## 2023-12-27 DIAGNOSIS — R634 Abnormal weight loss: Secondary | ICD-10-CM

## 2023-12-27 DIAGNOSIS — R627 Adult failure to thrive: Secondary | ICD-10-CM | POA: Diagnosis not present

## 2023-12-27 DIAGNOSIS — E119 Type 2 diabetes mellitus without complications: Secondary | ICD-10-CM | POA: Diagnosis not present

## 2023-12-27 NOTE — Patient Instructions (Addendum)
 Add in some sunflower seeds or pumpkin seeds for a snack to keep on you during the day.  Take one of your Ensures or Pediasures and mix it with whole milk, peanut butter, and a scoop of ice cream for a great high protein, high calorie milk shake!  Try cashew butter as a snack! Have a big spoonful whenever you like!  Choose higher calorie crackers like Laneta or Clubhouse! Add some peanut butter or cured meats (pepperoni, salami) to your crackers!  Weigh yourself each Saturday morning and look for a weight gain of 1-2 pounds each week!!  Place snacks around the house where you will see them on a regular basis. Try burning a cinnamon roll candle at times to get your appetite stimulated!

## 2023-12-27 NOTE — Progress Notes (Signed)
 Medical Nutrition Therapy  Appointment Start time:  1000  Appointment End time:  1100  Primary concerns today: Gaining Weight  Referral diagnosis: R63.4 (ICD-10-CM) - Abnormal weight loss, R62.7 (ICD-10-CM) - Failure to thrive in adult, E11.9 (ICD-10-CM) - Type 2 diabetes mellitus without complications Preferred learning style: No preference indicated Learning readiness: Change in progress   NUTRITION ASSESSMENT   Anthropometrics: Ht: 64 Wt: 130.3 lbs BMI: 22.37 kg/m2 Wt Hx: -50 lbs x7 months UBW: 170 lbs. Goal Weight: 150 lbs.  Clinical Medical Hx: HTN, HLD, T2DM, Moderate PCM, Cognitive Impairment w/ sundowning Medications: Mirtazapine, Omeprazole, Depakote, Donepezil Labs: Reviewed Notable Signs/Symptoms: Cognitive impairment  Lifestyle & Dietary Hx Pt daughter, Nathanel, present for appointment Pt reports living with daughter, daughter helps care for them. Daughter reports concern over unintentional weight loss after pt suffered a fall in May and sustained L4 fracture. Pt states they had corrective surgery in July, pt states they feel sturdy on their feet and move around regularly during the day now. Daughter states pt was started on Mirtazapine recently and has seen improvement in appetite and some weight gain, having a Pediasure/Ensure daily  in the morning. Daughter reports pt is a picky eater, but is eating more of the foods they choose now. 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    Estimated daily fluid intake: 48 oz Supplements: MVI Sleep: Sleeping well, recent R hip pain has pt sleeping on recliner  Stress / self-care: Low Current average weekly physical activity: ADLs  24-Hr Dietary Recall First Meal: Ensure, Honey Nut Cheerios w/ honey, Whole  milk Snack: Banana Second Meal: 2 cans of potted meat Snack: Apple, Orange Third Meal: 7 [employer] chicken nuggets, french fries, ketchup, Coke Snack: 1 piece of chocolate cake Beverages: Soda, Water, Ensure, Milk, Water, Gatorades    NUTRITION DIAGNOSIS  [place]-3.2 Unintentional weight loss As related to inadequate intake.  As evidenced by reports weight loss of 50 lbs (29%) in 7 months after fall, cognitive impairment leading to decreased PO intake.   NUTRITION INTERVENTION  Nutrition education (E-1) on the following topics:  Educated patient on the role of increasing calories and protein on building muscle mass and healthy weight gain. Worked with patient to identify sources of protein and healthy fats, as well as how to increase calories when preparing food. Educated patient to eat 5-6 times a day to maximize protein intake. Encourage patient to incorporate physical activity, especially resistance training, as much as possible.  Handouts Provided Include  High Calorie, High Protein Nutrition Therapy  Learning Style & Readiness for Change Teaching method utilized: Visual & Auditory  Demonstrated degree of understanding via: Teach Back  Barriers to learning/adherence to lifestyle change: Cognitive impairment  Goals Established by Pt Add in some sunflower seeds or pumpkin seeds for a snack to keep on you during the day. Take one of your Ensures or Pediasures and mix it with whole milk, peanut butter, and a scoop of ice cream for a great high protein, high calorie milk shake! Try cashew butter as a snack! Have a big spoonful whenever you like! Choose higher calorie crackers like Laneta or Clubhouse! Add some peanut butter or cured meats (pepperoni, salami) to your crackers! Weigh yourself each Saturday morning and look for a weight gain of 1-2 pounds each week!!  Place snacks around the house where you will see them on a regular basis. Try burning a cinnamon roll candle at times to get your  appetite stimulated!   MONITORING & EVALUATION Dietary intake, weekly physical activity, and weight gain in 6-8 weeks.  Next Steps  Patient is to follow up with RD.

## 2024-02-13 ENCOUNTER — Encounter: Admitting: Dietician
